# Patient Record
Sex: Female | Born: 2005 | Hispanic: Refuse to answer | Marital: Single | Smoking: Never smoker
Health system: Southern US, Community
[De-identification: ages and names within clinical notes are randomized; demographics above are authoritative.]

## PROBLEM LIST (undated history)

## (undated) DIAGNOSIS — G4733 Obstructive sleep apnea (adult) (pediatric): Secondary | ICD-10-CM

## (undated) DIAGNOSIS — F909 Attention-deficit hyperactivity disorder, unspecified type: Secondary | ICD-10-CM

## (undated) DIAGNOSIS — F419 Anxiety disorder, unspecified: Secondary | ICD-10-CM

## (undated) DIAGNOSIS — Z9989 Dependence on other enabling machines and devices: Secondary | ICD-10-CM

## (undated) HISTORY — PX: TYMPANOSTOMY TUBE PLACEMENT: SHX32

## (undated) HISTORY — PX: ADENOIDECTOMY: SUR15

---
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
## 2023-01-18 ENCOUNTER — Telehealth: Payer: Self-pay | Admitting: Child and Adolescent Psychiatry

## 2023-01-18 NOTE — Telephone Encounter (Signed)
Provider out of office. 7/16 & 7/23 message left answering machine to call and reschedule.

## 2023-01-23 ENCOUNTER — Other Ambulatory Visit: Payer: Self-pay | Admitting: Child and Adolescent Psychiatry

## 2023-01-23 DIAGNOSIS — F331 Major depressive disorder, recurrent, moderate: Secondary | ICD-10-CM

## 2023-01-23 DIAGNOSIS — F418 Other specified anxiety disorders: Secondary | ICD-10-CM

## 2023-01-25 ENCOUNTER — Other Ambulatory Visit: Payer: Self-pay | Admitting: Child and Adolescent Psychiatry

## 2023-01-25 DIAGNOSIS — F418 Other specified anxiety disorders: Secondary | ICD-10-CM

## 2023-01-25 DIAGNOSIS — F331 Major depressive disorder, recurrent, moderate: Secondary | ICD-10-CM

## 2023-01-27 ENCOUNTER — Telehealth: Payer: BC Managed Care – PPO | Admitting: Child and Adolescent Psychiatry

## 2023-02-02 ENCOUNTER — Other Ambulatory Visit: Payer: Self-pay | Admitting: Child and Adolescent Psychiatry

## 2023-02-02 DIAGNOSIS — F902 Attention-deficit hyperactivity disorder, combined type: Secondary | ICD-10-CM

## 2023-02-02 MED ORDER — LISDEXAMFETAMINE DIMESYLATE 30 MG PO CAPS: 30.0000 mg | ORAL_CAPSULE | Freq: Every day | ORAL | 0 refills | Status: DC

## 2023-02-02 NOTE — Telephone Encounter (Signed)
Requested rx sent.

## 2023-02-10 ENCOUNTER — Telehealth: Payer: BC Managed Care – PPO | Admitting: Child and Adolescent Psychiatry

## 2023-02-21 ENCOUNTER — Telehealth (INDEPENDENT_AMBULATORY_CARE_PROVIDER_SITE_OTHER): Payer: BC Managed Care – PPO | Admitting: Child and Adolescent Psychiatry

## 2023-02-21 DIAGNOSIS — F3341 Major depressive disorder, recurrent, in partial remission: Secondary | ICD-10-CM | POA: Diagnosis not present

## 2023-02-21 DIAGNOSIS — F840 Autistic disorder: Secondary | ICD-10-CM

## 2023-02-21 DIAGNOSIS — F418 Other specified anxiety disorders: Secondary | ICD-10-CM

## 2023-02-21 DIAGNOSIS — F902 Attention-deficit hyperactivity disorder, combined type: Secondary | ICD-10-CM | POA: Diagnosis not present

## 2023-02-21 MED ORDER — ESCITALOPRAM OXALATE 5 MG PO TABS: 5.0000 mg | ORAL_TABLET | Freq: Every day | ORAL | 1 refills | Status: DC

## 2023-02-21 NOTE — Progress Notes (Signed)
Virtual Visit via Video Note  I connected with Jasmine Carr on 02/21/23 at 11:30 AM EDT by a video enabled telemedicine application and verified that I am speaking with the correct person using two identifiers.  Location: Patient: home Provider: Office   I discussed the limitations of evaluation and management by telemedicine and the availability of in person appointments. The patient expressed understanding and agreed to proceed   I discussed the assessment and treatment plan with the patient. The patient was provided an opportunity to ask questions and all were answered. The patient agreed with the plan and demonstrated an understanding of the instructions.   The patient was advised to call back or seek an in-person evaluation if the symptoms worsen or if the condition fails to improve as anticipated.   Darcel Smalling, MD    [facility] MD/PA/NP OP Progress Note  02/21/2023 11:51 AM Jasmine Carr  MRN:  [id number]  Chief Complaint: Medication management follow-up for anxiety, ADHD and depression.  HPI:  This is a 17 year old Caucasian female, high school graduate with prior psychiatric history of ADHD, ODD, depression and anxiety, autism spectrum disorder, trichotillomania domiciled with biological parents.   She presents for follow-up after 3 months.  She denies any new concerns for today's appointment and reports that she has continued to do well.  She reports that she went to [place] and really enjoyed her trip.  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  She reports that she has been managing her anxiety okay, anxiety has gone up intermittently in the context of mother's father having dementia as well as court charges against mother for drinking at the [employer]  She reports that she has been managing her anxiety with hydroxyzine and it works well for her.  She denies  any problems with her mood, denies any low lows or depressed mood.  She denies any SI or HI.  She has been sleeping well with trazodone, however has not been using her CPAP, and therefore she has been feeling tired.  Discussed to start using it on a regular basis.  She was receptive to this.  She reports that she was recently seen by endocrinologist, her hemoglobin A1c went down from 5.8 to 5.5, she continues to have abnormal lipid panel.  She is recommended to see a nutritionist and dietitian, and we discussed importance of avoiding sweets which has been a challenge for her.  We discussed to continue with current medications because of the stability with her symptoms and follow-up again in about 3 months or earlier if needed.  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   Visit Diagnosis:    ICD-10-CM   1. Attention deficit hyperactivity disorder (ADHD), combined type  F90.2     2. Autism  F84.0     3. Recurrent major depressive disorder, in partial remission (HCC)  F33.41     4. Other specified anxiety disorders  F41.8           Past Psychiatric History:   Inpatient: None RTC: None Outpatient:     - Meds: She is currently prescribed Prozac 40 mg once a day, Abilify 2 mg at bedtime, clonidine 0.2 mg at bedtime.       - Past medication trials include Seroquel for  sleeping difficulties which was discontinued because of weight gain.  She has also tried various stimulants which were effective however stopped because of parents preference of keeping patient on less number of medications.   They deny any other medication trials.    - Therapy: Short term therapy - [facility]; Therapy off and on through out the life.. [facility] Hx of SI/HI:  None reported  Past Medical History:  Past Medical History:  Diagnosis Date   ADHD    Anxiety    OSA on CPAP  05/11/2018   severe    Past Surgical History:  Procedure Laterality Date   ADENOIDECTOMY     age 51 - [facility]   TYMPANOPLASTY WITH GRAFT Right 08/25/2018   Procedure: TYMPANOPLASTY WITH POSSIBLE CONCHOL CARTILAGE GRAFT;  Surgeon: Linus Salmons, MD;  Location: [facility];  Service: ENT;  Laterality: Right;   TYMPANOSTOMY TUBE PLACEMENT Bilateral    age 40 - [facility]    Family Psychiatric History:     Sister  - Committed suicide - at the age of 77.  Mother - Bipolar disorder Father - ADHD  Family History: No family history on file.  Social History:  Social History   Socioeconomic History   Marital status: Single    Spouse name: Not on file   Number of children: Not on file   Years of education: Not on file   Highest education level: Not on file  Occupational History   Not on file  Tobacco Use   Smoking status: Never   Smokeless tobacco: Never  Vaping Use   Vaping status: Never Used  Substance and Sexual Activity   Alcohol use: Never   Drug use: Never   Sexual activity: Not on file  Other Topics Concern   Not on file  Social History Narrative   Not on file   Social Determinants of Health   Financial Resource Strain: Low Risk  (08/21/2021)   Received [facility], [facility]   Overall Financial Resource Strain (CARDIA)    Difficulty of Paying Living Expenses: Not hard at all  Food Insecurity: No Food Insecurity (08/21/2021)   Received from [facility]   Hunger Vital Sign  Transportation Needs: No Transportation Needs (08/21/2021)   Received from [facility], [facility]   [facility] Transportation    Lack of Transportation (Medical): No    Lack of Transportation (Non-Medical): No  Physical Activity: Not on file  Stress: Not on file  Social Connections: Not on file    Allergies: No Known Allergies  Metabolic Disorder Labs: No results found for: "HGBA1C", "MPG" No results found for:  "PROLACTIN" No results found for: "CHOL", "TRIG", "HDL", "CHOLHDL", "VLDL", "LDLCALC" No results found for: "TSH"  Therapeutic Level Labs: No results found for: "LITHIUM" No results found for: "VALPROATE" No results found for: "CBMZ"  Current Medications: Current Outpatient Medications  Medication Sig Dispense Refill   ARIPiprazole (ABILIFY) 2 MG tablet TAKE 1 TABLET BY MOUTH EVERY DAY 90 tablet 1   escitalopram (LEXAPRO) 10 MG tablet TAKE 1 TABLET BY MOUTH EVERY DAY 90 tablet 1   escitalopram (LEXAPRO) 5 MG tablet Take 1 tablet (5 mg total) by mouth daily. 90 tablet 1   famotidine (PEPCID) 20 MG tablet Take by mouth.     ferrous sulfate 325 (65 FE) MG tablet Take 325 mg by mouth 2 (two) times daily.     fluticasone (FLONASE)  50 MCG/ACT nasal spray Place 1 spray into both nostrils 2 (two) times daily.     hydrOXYzine (ATARAX) 25 MG tablet TAKE 1 TABLET BY MOUTH TWICE A DAY 180 tablet 1   ibuprofen (ADVIL) 600 MG tablet Take 1 tablet (600 mg total) by mouth every 8 (eight) hours as needed. 30 tablet 0   imiquimod (ALDARA) 5 % cream Apply topically 2 (two) times a week.     levonorgestrel-ethinyl estradiol (SEASONALE) 0.15-0.03 MG tablet Take 1 tablet by mouth daily.     lisdexamfetamine (VYVANSE) 30 MG capsule Take 1 capsule (30 mg total) by mouth daily. 30 capsule 0   lisdexamfetamine (VYVANSE) 30 MG capsule Take 1 capsule (30 mg total) by mouth daily. 30 capsule 0   metFORMIN (GLUCOPHAGE) 500 MG tablet Take by mouth.     ofloxacin (FLOXIN) 0.3 % OTIC solution 5 drops 2 (two) times daily.     RYBELSUS 7 MG TABS Take 1 tablet by mouth daily.     traZODone (DESYREL) 100 MG tablet TAKE 1.5 TABLETS (150 MG TOTAL) BY MOUTH AT BEDTIME AS NEEDED FOR SLEEP. 135 tablet 1   triamcinolone cream (KENALOG) 0.1 % Apply topically 2 (two) times daily as needed.     No current facility-administered medications for this visit.     Musculoskeletal: Strength & Muscle Tone: unable to assess since visit  was over the telemedicine. Gait & Station: unable to assess since visit was over the telemedicine. Patient leans: N/A  Psychiatric Specialty Exam: Review of Systems  There were no vitals taken for this visit.There is no height or weight on file to calculate BMI.  General Appearance: Casual and obese  Eye Contact:  Fair  Speech:  Clear and Coherent and Normal Rate  Volume:  Normal  Mood:  Anxious  Affect:  Appropriate, Congruent, and Constricted  Thought Process:  Goal Directed and Linear  Orientation:  Full (Time, Place, and Person)  Thought Content: Logical   Suicidal Thoughts:  No  Homicidal Thoughts:  No  Memory:  Immediate;   Fair Recent;   Fair Remote;   Fair  Judgement:  Fair  Insight:  Fair  Psychomotor Activity:  Normal  Concentration:  Concentration: Fair and Attention Span: Fair  Recall:  [employer] of Knowledge: Fair  Language: Fair  Akathisia:  No    AIMS (if indicated): not done  Assets:  Communication Skills Desire for Improvement Financial Resources/Insurance Leisure Time Physical Health Social Support Transportation Vocational/Educational  ADL's:  Intact  Cognition: WNL  Sleep:   Fair     Screenings: GAD-7    Advertising copywriter from 01/01/2020 in [facility]  Total GAD-7 Score 5      PHQ2-9    Flowsheet Row Counselor from 01/01/2020 in [facility]  PHQ-2 Total Score 2  PHQ-9 Total Score 3      Flowsheet Row ED from 05/10/2022 in [facility]  ED from 08/12/2020 in [facility]   C-SSRS RISK CATEGORY No Risk Error: Question 6 not populated        Assessment and Plan:   17 year old  [place] female with prior psychiatric history of ADHD; ODD; depression; anxiety; autism spectrum disorder; trichotillomania.   Update on 02/21/23  -she appears to have continued stability with mood, ADHD.her anxiety also appears stable  except situational worsening reported by her which she manages by taking hydroxyzine.  Recommending to continue with  current medications because of the stability in her symptoms and follow-up again in about 3 months or earlier if needed.     Plan:  # Depression(improving) Anxiety(improving)  - Continue with Lexapro 15 mg daily.  - Continue with Abilify 2 mg daily.   # ADHD - Discussed to Continue with Vyvanse 30 mg daily for attention problems.  # Sleep - Continue Trazodone 150 mg QHS PRN for sleep.    - Take Atarax 25-50 mg QHS PRN for sleep or anxiety.  - Discussed the recommendation of ind therapy for anxiety and grief.    - Follow up in 2-3 months or early if needed or if symptoms worsens.   This note was generated in part or whole with voice recognition software. Voice recognition is usually quite accurate but there are transcription errors that can and very often do occur. I apologize for any typographical errors that were not detected and corrected.         Darcel Smalling, MD 02/21/2023, 11:51 AM

## 2023-03-15 ENCOUNTER — Telehealth: Payer: Self-pay

## 2023-03-15 NOTE — Telephone Encounter (Signed)
pt left message that stated that she is out of her trazodone and needed refills. pt should have enough pill until oct

## 2023-03-15 NOTE — Telephone Encounter (Signed)
called patient and told her that per the pharmacy she picked up rx on 8-3 and she should have enough until 11-3. pt states that she has been taking 2 pill instead of 1.5pills.

## 2023-03-15 NOTE — Telephone Encounter (Signed)
Please call patient and let her know to not take more than 1.5 tablets. Even if she is taking tow pills she should have until the end of this month.

## 2023-03-15 NOTE — Telephone Encounter (Signed)
called pharmacy pt last picked up on 8-3 pt should have enough until nov 3.

## 2023-03-16 NOTE — Telephone Encounter (Signed)
I spoke with her over the phone. Instructions clearly says it is 1.5 tablets at bedtime. Pt reported that she is taking two of them, and I discussed that she is on 1.5 tablets of trazodone at bedtime of 100 mg. She reported that she does not know if any medications are left at their primary residence but will check. Discussed that they can refill prescription as there is a refill but insurance might not allow them to refill. She verbalized understanding. She will call back for questions.

## 2023-03-16 NOTE — Telephone Encounter (Signed)
pt had her mom get on the phone also. she states that on the bottle it states take 1.5- 2 pills and she is out and she wants to know why you will not refill.

## 2023-04-07 ENCOUNTER — Telehealth: Payer: Self-pay

## 2023-04-07 NOTE — Telephone Encounter (Signed)
pt mother is calling states that Jasmine Carr is out of trazodone and that she is leaving to go to [place] and she needs her medication.

## 2023-04-07 NOTE — Telephone Encounter (Signed)
Please call patient and let her know. Thanks

## 2023-04-07 NOTE — Telephone Encounter (Signed)
called pharmacy and they state that rx was picked up on 8-3 not due again until 11-3 and insurance will not pay for until 10-29.

## 2023-04-07 NOTE — Telephone Encounter (Signed)
Please let her know that I can send prescription of Trazodone 200 mg at bedtime in a hope that it gets covered, but she needs to only take Trazodone 150 mg(1.5 tablets). Will send prescription if they are ok with this. Thanks

## 2023-04-07 NOTE — Telephone Encounter (Signed)
pt mother is aware that she would have to pay for medication - she want to know what else she can take since she taken all of the other medication she had and she needs something to help her sleep.

## 2023-04-08 NOTE — Telephone Encounter (Signed)
I called pt and left VM that we can try sending higher dose of trazodone rx to see if they are able to fill it. Asked her to call back to discuss.

## 2023-05-25 ENCOUNTER — Encounter: Payer: Self-pay | Admitting: Gastroenterology

## 2023-05-30 ENCOUNTER — Telehealth (INDEPENDENT_AMBULATORY_CARE_PROVIDER_SITE_OTHER): Payer: BC Managed Care – PPO | Admitting: Child and Adolescent Psychiatry

## 2023-05-30 DIAGNOSIS — F3341 Major depressive disorder, recurrent, in partial remission: Secondary | ICD-10-CM | POA: Diagnosis not present

## 2023-05-30 DIAGNOSIS — G479 Sleep disorder, unspecified: Secondary | ICD-10-CM

## 2023-05-30 DIAGNOSIS — F909 Attention-deficit hyperactivity disorder, unspecified type: Secondary | ICD-10-CM | POA: Diagnosis not present

## 2023-05-30 DIAGNOSIS — F418 Other specified anxiety disorders: Secondary | ICD-10-CM | POA: Diagnosis not present

## 2023-05-30 DIAGNOSIS — F330 Major depressive disorder, recurrent, mild: Secondary | ICD-10-CM | POA: Insufficient documentation

## 2023-05-30 MED ORDER — TRAZODONE HCL 100 MG PO TABS: 150.0000 mg | ORAL_TABLET | Freq: Every evening | ORAL | 1 refills | Status: DC | PRN

## 2023-05-30 MED ORDER — ARIPIPRAZOLE 2 MG PO TABS: 2.0000 mg | ORAL_TABLET | Freq: Every day | ORAL | 1 refills | Status: DC

## 2023-05-30 NOTE — Progress Notes (Signed)
Virtual Visit via Video Note  I connected with Jasmine Carr on 05/30/23 at 11:30 AM EST by a video enabled telemedicine application and verified that I am speaking with the correct person using two identifiers.  Location: Patient: home Provider: Office   I discussed the limitations of evaluation and management by telemedicine and the availability of in person appointments. The patient expressed understanding and agreed to proceed   I discussed the assessment and treatment plan with the patient. The patient was provided an opportunity to ask questions and all were answered. The patient agreed with the plan and demonstrated an understanding of the instructions.   The patient was advised to call back or seek an in-person evaluation if the symptoms worsen or if the condition fails to improve as anticipated.   Jasmine Smalling, MD    [facility] MD/PA/NP OP Progress Note  05/30/2023 12:38 PM [name]  MRN:  [id number]  Chief Complaint: Medication management follow-up for anxiety, ADHD and depression.  HPI:  This is a 17 year old Caucasian female, high school graduate with prior psychiatric history of ADHD, ODD, depression and anxiety, autism spectrum disorder, trichotillomania domiciled with biological parents.   She presented today for follow-up after 3 months.  She reported that Thanksgiving weekend was difficult, as some of the family members made fun of her, and that caused more anxiety and depression for her.  She reported that additionally she has had other psychosocial stressors in the family such as her grandfather's cancer coming back, financial strains in the family, and that is also made her more anxious and sad.  She reported that this occurs mostly at night and during the day she is doing better.  She reported that last night because of her uncle and his wife were making fun of her and that lead to more anxiety.  We discussed that this seems to be in the  context of her current psychosocial stressors.  She reported that prior to Thanksgiving she was doing better after speaking with her therapist last night and her friend, she has been feeling good today.  She reported that she has been taking medications consistently although her medication refills history does not suggest that, and continued to report that she has been consistently taking her medications.  She reported that she sees her therapist as needed, we discussed to see a therapist on a regular basis, she verbalized understanding and agreed with this.  We discussed medications and she believes that her current medications have been working well for her and would like to continue.  We discussed to continue with current medications, emphasized importance of medication adherence, she was receptive to this.  She denied any SI or HI, reported that she has been eating and sleeping well. She will follow-up again in about 3 months or earlier if needed.    Visit Diagnosis:    ICD-10-CM   1. Other specified anxiety disorders  F41.8     2. Recurrent major depressive disorder, in partial remission (HCC)  F33.41        Past Psychiatric History:   Inpatient: None RTC: None Outpatient:     - Meds: She is currently prescribed Prozac 40 mg once a day, Abilify 2 mg at bedtime, clonidine 0.2 mg at bedtime.       - Past medication trials include Seroquel for sleeping difficulties which was discontinued because of weight gain.  She has also tried various stimulants which were effective however stopped because of parents preference of  keeping patient on less number of medications.   They deny any other medication trials.    - Therapy: Short term therapy - [facility]; Therapy off and on through out the life.. [facility] Hx of SI/HI:  None reported  Past Medical History:  Past Medical History:  Diagnosis Date   ADHD    Anxiety    OSA on CPAP 05/11/2018   severe    Past  Surgical History:  Procedure Laterality Date   ADENOIDECTOMY     age 35 - [facility]   TYMPANOPLASTY WITH GRAFT Right 08/25/2018   Procedure: TYMPANOPLASTY WITH POSSIBLE CONCHOL CARTILAGE GRAFT;  Surgeon: Linus Salmons, MD;  Location: [facility];  Service: ENT;  Laterality: Right;   TYMPANOSTOMY TUBE PLACEMENT Bilateral    age 63 - [facility]    Family Psychiatric History:     Sister  - Committed suicide - at the age of 61.  Mother - Bipolar disorder Father - ADHD  Family History: No family history on file.  Social History:  Social History   Socioeconomic History   Marital status: Single    Spouse name: Not on file   Number of children: Not on file   Years of education: Not on file   Highest education level: Not on file  Occupational History   Not on file  Tobacco Use   Smoking status: Never   Smokeless tobacco: Never  Vaping Use   Vaping status: Never Used  Substance and Sexual Activity   Alcohol use: Never   Drug use: Never   Sexual activity: Not on file  Other Topics Concern   Not on file  Social History Narrative   Not on file   Social Determinants of Health   Financial Resource Strain: Patient Declined (04/27/2023)   Received from [facility]   Overall Financial Resource Strain (CARDIA)    Difficulty of Paying Living Expenses: Patient declined  Food Insecurity: Patient Declined (04/27/2023)   Received from [facility]   Hunger Vital Sign    Worried About Running Out of Food in the Last Year: Patient declined    Ran Out of Food in the Last Year: Patient declined  Transportation Needs: Patient Declined (04/27/2023)   Received from [facility]   PRAPARE - Transportation    In the past 12 months, has lack of transportation kept you from medical appointments or from getting medications?: Patient declined    Lack of Transportation (Non-Medical): Patient declined  Physical  Activity: Not on file  Stress: Not on file  Social Connections: Not on file    Allergies: No Known Allergies  Metabolic Disorder Labs: No results found for: "HGBA1C", "MPG" No results found for: "PROLACTIN" No results found for: "CHOL", "TRIG", "HDL", "CHOLHDL", "VLDL", "LDLCALC" No results found for: "TSH"  Therapeutic Level Labs: No results found for: "LITHIUM" No results found for: "VALPROATE" No results found for: "CBMZ"  Current Medications: Current Outpatient Medications  Medication Sig Dispense Refill   ARIPiprazole (ABILIFY) 2 MG tablet Take 1 tablet (2 mg total) by mouth daily. 90 tablet 1   escitalopram (LEXAPRO) 10 MG tablet TAKE 1 TABLET BY MOUTH EVERY DAY 90 tablet 1   escitalopram (LEXAPRO) 5 MG tablet Take 1 tablet (5 mg total) by mouth daily. 90 tablet 1   famotidine (PEPCID) 20 MG tablet Take by mouth.     ferrous sulfate 325 (65 FE) MG tablet Take 325 mg  by mouth 2 (two) times daily.     fluticasone (FLONASE) 50 MCG/ACT nasal spray Place 1 spray into both nostrils 2 (two) times daily.     hydrOXYzine (ATARAX) 25 MG tablet TAKE 1 TABLET BY MOUTH TWICE A DAY 180 tablet 1   ibuprofen (ADVIL) 600 MG tablet Take 1 tablet (600 mg total) by mouth every 8 (eight) hours as needed. 30 tablet 0   imiquimod (ALDARA) 5 % cream Apply topically 2 (two) times a week.     levonorgestrel-ethinyl estradiol (SEASONALE) 0.15-0.03 MG tablet Take 1 tablet by mouth daily.     lisdexamfetamine (VYVANSE) 30 MG capsule Take 1 capsule (30 mg total) by mouth daily. 30 capsule 0   lisdexamfetamine (VYVANSE) 30 MG capsule Take 1 capsule (30 mg total) by mouth daily. 30 capsule 0   metFORMIN (GLUCOPHAGE) 500 MG tablet Take by mouth.     ofloxacin (FLOXIN) 0.3 % OTIC solution 5 drops 2 (two) times daily.     RYBELSUS 7 MG TABS Take 1 tablet by mouth daily.     traZODone (DESYREL) 100 MG tablet Take 1.5 tablets (150 mg total) by mouth at bedtime as needed for sleep. 135 tablet 1   triamcinolone  cream (KENALOG) 0.1 % Apply topically 2 (two) times daily as needed.     No current facility-administered medications for this visit.     Musculoskeletal: Strength & Muscle Tone: unable to assess since visit was over the telemedicine. Gait & Station: unable to assess since visit was over the telemedicine. Patient leans: N/A  Psychiatric Specialty Exam: Review of Systems  There were no vitals taken for this visit.There is no height or weight on file to calculate BMI.  General Appearance: Casual and obese  Eye Contact:  Fair  Speech:  Clear and Coherent and Normal Rate  Volume:  Normal  Mood:  Anxious  Affect:  Appropriate, Congruent, and Restricted  Thought Process:  Goal Directed and Linear  Orientation:  Full (Time, Place, and Person)  Thought Content: Logical   Suicidal Thoughts:  No  Homicidal Thoughts:  No  Memory:  Immediate;   Fair Recent;   Fair Remote;   Fair  Judgement:  Fair  Insight:  Fair  Psychomotor Activity:  Normal  Concentration:  Concentration: Fair and Attention Span: Fair  Recall:  [employer] of Knowledge: Fair  Language: Fair  Akathisia:  No    AIMS (if indicated): not done  Assets:  Communication Skills Desire for Improvement Financial Resources/Insurance Leisure Time Physical Health Social Support Transportation Vocational/Educational  ADL's:  Intact  Cognition: WNL  Sleep:   Fair     Screenings: GAD-7    Advertising copywriter from 01/01/2020 in [facility]  Total GAD-7 Score 5      PHQ2-9    Flowsheet Row Counselor from 01/01/2020 in [facility]  PHQ-2 Total Score 2  PHQ-9 Total Score 3      Flowsheet Row ED from 05/10/2022 in [facility]  ED from 08/12/2020 in [facility]   C-SSRS RISK CATEGORY No Risk Error: Question 6 not populated        Assessment and Plan:   17 year old  [place] female with  prior psychiatric history of ADHD; ODD; depression; anxiety; autism spectrum disorder; trichotillomania.   Update on 05/30/23  -she appears to have intermittent worsening of anxiety and mood in the context of current psychosocial stressors, however  reported that she has been able to manage mood and anxiety, is recommended to establish outpatient therapy on a regular basis as compared to as needed which she is currently working on.  She would like to continue with current medications and will follow up again in about 3 months or earlier if needed.       Plan:  # Depression(improving) Anxiety(improving)  - Continue with Lexapro 15 mg daily.  - Continue with Abilify 2 mg daily.   # ADHD - Discussed to Continue with Vyvanse 30 mg daily for attention problems.  # Sleep - Continue Trazodone 150 mg QHS PRN for sleep.    - Take Atarax 25-50 mg QHS PRN for sleep or anxiety.  - Discussed the recommendation of ind therapy for anxiety and grief.    - Follow up in 2-3 months or early if needed or if symptoms worsens.   This note was generated in part or whole with voice recognition software. Voice recognition is usually quite accurate but there are transcription errors that can and very often do occur. I apologize for any typographical errors that were not detected and corrected.         Jasmine Smalling, MD 05/30/2023, 12:38 PM

## 2023-05-30 NOTE — Telephone Encounter (Signed)
She did attend appointment and scheduled for follow up in 3 months.

## 2023-06-02 IMAGING — CR DG TOE GREAT 2+V*L*
4 series · 4 of 4 positions shown · non-contrast
Comparison: None

CLINICAL DATA: Kicked a table is school 2 days ago, pain great toe

EXAM:
LEFT GREAT TOE

[toe ap]
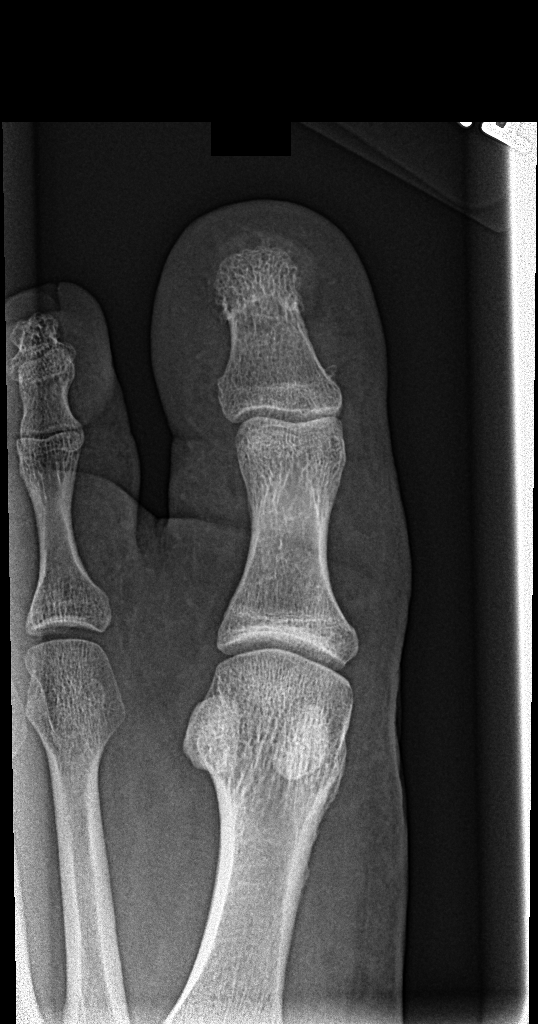

[toe obl (1 of 2)]
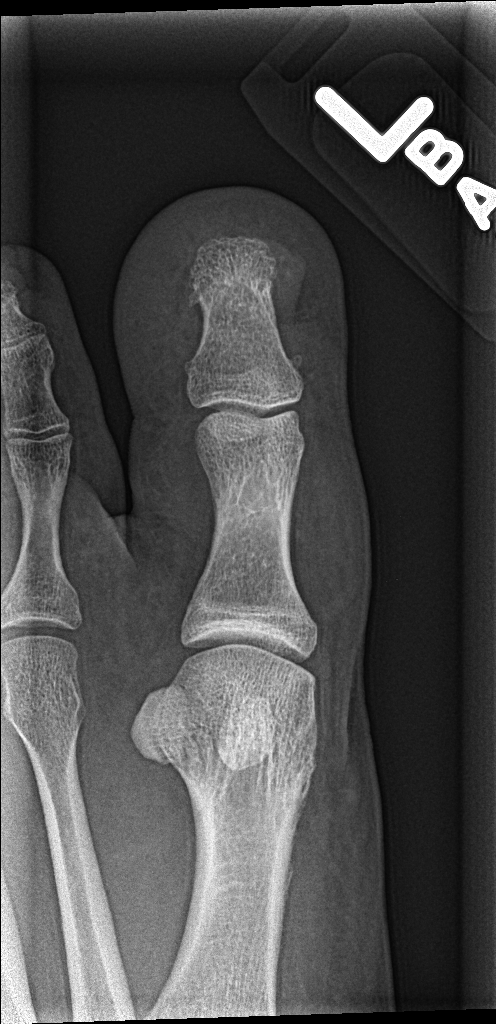

[toe lat]
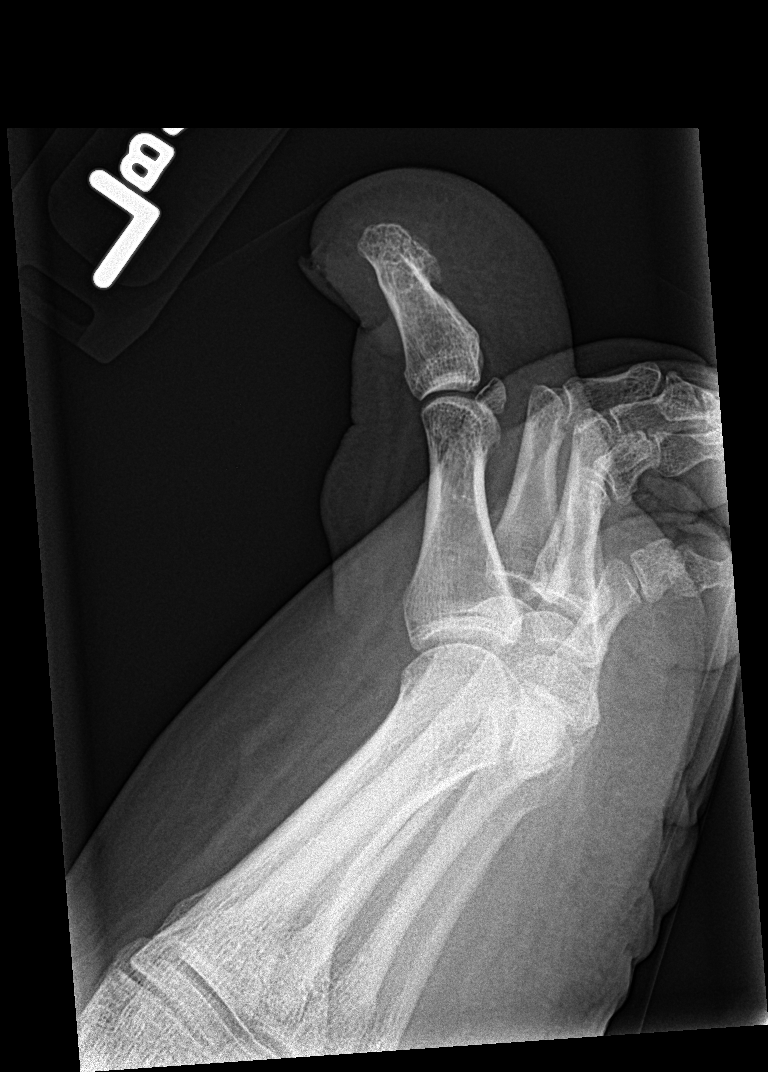

[toe obl (2 of 2)]
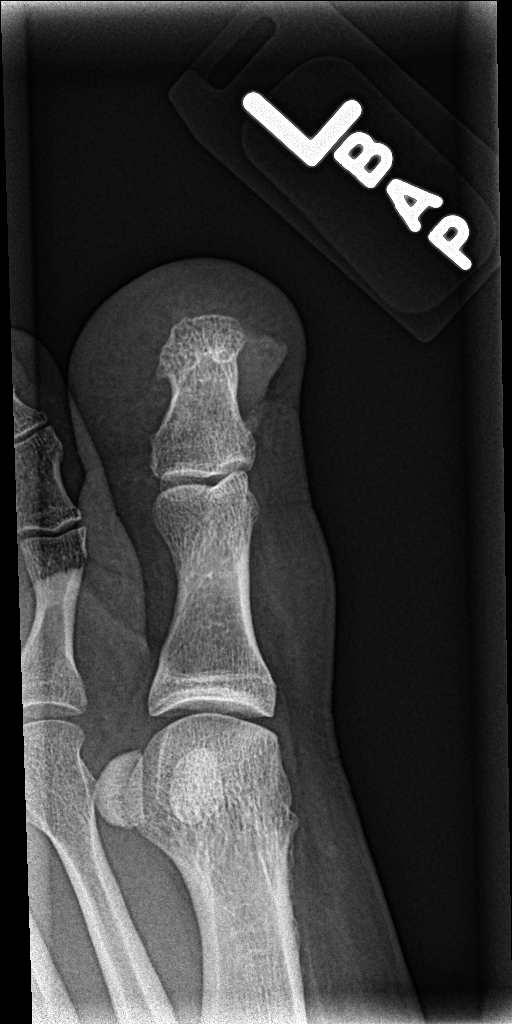

[4 of 4 positions shown; findings below may reference images not displayed]

FINDINGS: Osseous mineralization normal.

Joint spaces preserved.

No acute fracture, dislocation, or bone destruction.
IMPRESSION: Normal exam.

## 2023-06-23 ENCOUNTER — Other Ambulatory Visit: Payer: Self-pay | Admitting: Child and Adolescent Psychiatry

## 2023-06-23 DIAGNOSIS — F902 Attention-deficit hyperactivity disorder, combined type: Secondary | ICD-10-CM

## 2023-06-23 MED ORDER — LISDEXAMFETAMINE DIMESYLATE 30 MG PO CAPS: 30.0000 mg | ORAL_CAPSULE | Freq: Every day | ORAL | 0 refills | Status: DC

## 2023-06-23 NOTE — Telephone Encounter (Signed)
Rx sent 

## 2023-06-30 ENCOUNTER — Ambulatory Visit: Admission: RE | Admit: 2023-06-30 | Payer: Self-pay | Source: Ambulatory Visit | Admitting: Gastroenterology

## 2023-06-30 SURGERY — COLONOSCOPY WITH PROPOFOL
Anesthesia: General

## 2023-08-28 ENCOUNTER — Telehealth: Payer: Self-pay | Admitting: Child and Adolescent Psychiatry

## 2023-08-28 DIAGNOSIS — F418 Other specified anxiety disorders: Secondary | ICD-10-CM

## 2023-08-28 DIAGNOSIS — F331 Major depressive disorder, recurrent, moderate: Secondary | ICD-10-CM

## 2023-08-28 NOTE — Telephone Encounter (Signed)
 Patient cancelled by phone. Confirmed on 08-28-23. She currently has no insurance but will call when able to come back. States she does need a refill on medication if able to do so to help bridge the gap.

## 2023-08-29 ENCOUNTER — Telehealth: Payer: Self-pay | Admitting: Child and Adolescent Psychiatry

## 2023-08-29 MED ORDER — ESCITALOPRAM OXALATE 5 MG PO TABS: 5.0000 mg | ORAL_TABLET | Freq: Every day | ORAL | 1 refills | Status: DC

## 2023-08-29 MED ORDER — ESCITALOPRAM OXALATE 10 MG PO TABS: 10.0000 mg | ORAL_TABLET | Freq: Every day | ORAL | 1 refills | Status: DC

## 2023-08-29 NOTE — Telephone Encounter (Signed)
 Called patient and made her aware of message she voiced understanding

## 2023-08-29 NOTE — Telephone Encounter (Signed)
 Thanks

## 2023-08-29 NOTE — Telephone Encounter (Signed)
 I sent refills on Lexapro. She should have refills left on her other medications(Abilify, Vyvanse and Trazodone). Thanks

## 2023-10-11 ENCOUNTER — Telehealth: Payer: Self-pay | Admitting: Physician Assistant

## 2023-10-11 ENCOUNTER — Ambulatory Visit: Payer: Self-pay

## 2023-10-11 DIAGNOSIS — B9689 Other specified bacterial agents as the cause of diseases classified elsewhere: Secondary | ICD-10-CM

## 2023-10-11 DIAGNOSIS — J208 Acute bronchitis due to other specified organisms: Secondary | ICD-10-CM

## 2023-10-11 MED ORDER — ALBUTEROL SULFATE HFA 108 (90 BASE) MCG/ACT IN AERS: 2.0000 | INHALATION_SPRAY | Freq: Four times a day (QID) | RESPIRATORY_TRACT | 0 refills | Status: AC | PRN

## 2023-10-11 MED ORDER — BENZONATATE 100 MG PO CAPS: 100.0000 mg | ORAL_CAPSULE | Freq: Three times a day (TID) | ORAL | 0 refills | Status: DC | PRN

## 2023-10-11 MED ORDER — AZITHROMYCIN 250 MG PO TABS: ORAL_TABLET | ORAL | 0 refills | Status: AC

## 2023-10-11 NOTE — Telephone Encounter (Signed)
 Copied from CRM [id number]. Topic: Clinical - Red Word Triage >> Oct 11, 2023  3:22 PM Jasmine Carr wrote: [facility] that prompted transfer to Nurse Triage: fever (101.7), cough, congestion, sore throat   Chief Complaint: Dry cough, Symptoms: dry cough, fever, nausea, diarrhea, sore throat, headache Frequency: x 3 days Pertinent Negatives: Patient denies difficulty, vomiting, chest pain Disposition: [] ED /[x] Urgent Care (no appt availability in office) / [] Appointment(In office/virtual)/ []  [facility]/ [] Home Care/ [] Refused Recommended Disposition /[] [facility]/ []  Follow-up with PCP Additional Notes: Patient'Carr mother called and advised they took an 8 day cruise out of the country and started feeling bad 3 days ago. They returned back from the cruise 2 days ago.  They did not have any known contacts with anyone sick that they are aware of. She had a fever last night and had been taking Tylenol.  Mother hasn't checked her temperature today.  She is having a dry cough, nausea, diarrhea, sore throat, and headache. Mother denies the patient having any difficulty breathing, vomiting, or chest pain at this time. Mother is advised that the PCP office that she wants to get her daughter established at, [facility], does not have any openings anytime soon for her to be seen. Mother is advised that the best recommendation at this time is to take her to an Urgent Care or the Emergency Room to get her evaluated for her symptoms at this time and then after she is taken care of, she can call back to the office and become an established patient for continuation of care. Mother is advised that if the patient gets worse to take her to the Emergency Room for immediate medical attention. Mother verbalized understanding but stated that if she had known there were no appointments available, she wouldn't have gone through the triage questions with this RN.  Reason for Disposition  SEVERE  coughing spells (e.g., whooping sound after coughing, vomiting after coughing)  Answer Assessment - Initial Assessment Questions 1. ONSET: "When did the cough begin?"      3 days ago 2. SEVERITY: "How bad is the cough today?"      Coughing spells 3. SPUTUM: "Describe the color of your sputum" (none, dry cough; clear, white, yellow, green)     N/A 4. HEMOPTYSIS: "Are you coughing up any blood?" If so ask: "How much?" (flecks, streaks, tablespoons, etc.)     No 5. DIFFICULTY BREATHING: "Are you having difficulty breathing?" If Yes, ask: "How bad is it?" (e.g., mild, moderate, severe)    - MILD: No SOB at rest, mild SOB with walking, speaks normally in sentences, can lie down, no retractions, pulse < 100.    - MODERATE: SOB at rest, SOB with minimal exertion and prefers to sit, cannot lie down flat, speaks in phrases, mild retractions, audible wheezing, pulse 100-120.    - SEVERE: Very SOB at rest, speaks in single words, struggling to breathe, sitting hunched forward, retractions, pulse > 120      No 6. FEVER: "Do you have a fever?" If Yes, ask: "What is your temperature, how was it measured, and when did it start?"     Yes--last night was 101.7---taking Tylenol 7. CARDIAC HISTORY: "Do you have any history of heart disease?" (e.g., heart attack, congestive heart failure)      No 8. LUNG HISTORY: "Do you have any history of lung disease?"  (e.g., pulmonary embolus, asthma, emphysema)     Has a rescue inhaler but no diagnosed  lung conditions 9. PE RISK FACTORS: "Do you have a history of blood clots?" (or: recent major surgery, recent prolonged travel, bedridden)     No 10. OTHER SYMPTOMS: "Do you have any other symptoms?" (e.g., runny nose, wheezing, chest pain)       Runny nose, congestion, Fever, sore throat 11. PREGNANCY: "Is there any chance you are pregnant?" "When was your last menstrual period?"       No--90 day birth control 83. TRAVEL: "Have you traveled out of the country in the  last month?" (e.g., travel history, exposures)       Yes---8 day cruise out of the country  Protocols used: Cough - Acute Non-Productive-A-AH

## 2023-10-11 NOTE — Progress Notes (Signed)
 Virtual Visit Consent   Jasmine Carr, you are scheduled for a virtual visit with a [facility] provider today. Just as with appointments in the office, your consent must be obtained to participate. Your consent will be active for this visit and any virtual visit you may have with one of our providers in the next 365 days. If you have a MyChart account, a copy of this consent can be sent to you electronically.  As this is a virtual visit, video technology does not allow for your provider to perform a traditional examination. This may limit your provider's ability to fully assess your condition. If your provider identifies any concerns that need to be evaluated in person or the need to arrange testing (such as labs, EKG, etc.), we will make arrangements to do so. Although advances in technology are sophisticated, we cannot ensure that it will always work on either your end or our end. If the connection with a video visit is poor, the visit may have to be switched to a telephone visit. With either a video or telephone visit, we are not always able to ensure that we have a secure connection.  By engaging in this virtual visit, you consent to the provision of healthcare and authorize for your insurance to be billed (if applicable) for the services provided during this visit. Depending on your insurance coverage, you may receive a charge related to this service.  I need to obtain your verbal consent now. Are you willing to proceed with your visit today? Tekoa Suzanne Ruehl has provided verbal consent on 10/11/2023 for a virtual visit (video or telephone). Jasmine Carr, [place]  Date: 10/11/2023 4:44 PM   Virtual Visit via Video Note   I, Jasmine Carr, connected with  Jasmine Carr  ([id number], [birth year]) on 10/11/23 at  4:45 PM EDT by a video-enabled telemedicine application and verified that I am speaking with the correct person using two  identifiers.  Location: Patient: Virtual Visit Location Patient: Home Provider: Virtual Visit Location Provider: Home Office   I discussed the limitations of evaluation and management by telemedicine and the availability of in person appointments. The patient expressed understanding and agreed to proceed.    History of Present Illness: Jasmine Carr is a 18 y.o. who identifies as a female who was assigned female at birth, and is being seen today for URI symptoms starting over the past week. Notes chest congestion, cough that is productive, nausea with nasal congestion. Notes now with fever at 101.7. Notes some chest tightness with wheezing. Denies sick contact.   OTC -- Tylenol  HPI: HPI  Problems:  Patient Active Problem List   Diagnosis Date Noted   Mild episode of recurrent major depressive disorder (HCC) 05/30/2023   Attention deficit hyperactivity disorder (ADHD), combined type 09/25/2019   Recurrent major depressive disorder, in partial remission (HCC) 09/25/2019   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   Other insomnia 09/25/2019   Autism 09/25/2019    Allergies: No Known Allergies Medications:  Current Outpatient Medications:    albuterol (VENTOLIN HFA) 108 (90 Base) MCG/ACT inhaler, Inhale 2 puffs into the lungs every 6 (six) hours as needed for wheezing or shortness of breath., Disp: 8 g, Rfl: 0   azithromycin (ZITHROMAX) 250 MG tablet, Take 2 tablets on day 1, then 1 tablet daily on days 2 through 5, Disp: 6 tablet, Rfl: 0   benzonatate (TESSALON) 100 MG capsule, Take 1 capsule (100 mg total) by mouth 3 (three)  times daily as needed for cough., Disp: 30 capsule, Rfl: 0   ARIPiprazole (ABILIFY) 2 MG tablet, Take 1 tablet (2 mg total) by mouth daily., Disp: 90 tablet, Rfl: 1   escitalopram (LEXAPRO) 10 MG tablet, Take 1 tablet (10 mg total) by mouth daily., Disp: 90 tablet, Rfl: 1   escitalopram (LEXAPRO) 5 MG tablet, Take 1 tablet (5 mg total) by mouth  daily., Disp: 90 tablet, Rfl: 1   famotidine (PEPCID) 20 MG tablet, Take by mouth., Disp: , Rfl:    ferrous sulfate 325 (65 FE) MG tablet, Take 325 mg by mouth 2 (two) times daily., Disp: , Rfl:    fluticasone (FLONASE) 50 MCG/ACT nasal spray, Place 1 spray into both nostrils 2 (two) times daily., Disp: , Rfl:    hydrOXYzine (ATARAX) 25 MG tablet, TAKE 1 TABLET BY MOUTH TWICE A DAY, Disp: 180 tablet, Rfl: 1   ibuprofen (ADVIL) 600 MG tablet, Take 1 tablet (600 mg total) by mouth every 8 (eight) hours as needed., Disp: 30 tablet, Rfl: 0   imiquimod (ALDARA) 5 % cream, Apply topically 2 (two) times a week., Disp: , Rfl:    levonorgestrel-ethinyl estradiol (SEASONALE) 0.15-0.03 MG tablet, Take 1 tablet by mouth daily., Disp: , Rfl:    lisdexamfetamine (VYVANSE) 30 MG capsule, Take 1 capsule (30 mg total) by mouth daily., Disp: 30 capsule, Rfl: 0   lisdexamfetamine (VYVANSE) 30 MG capsule, Take 1 capsule (30 mg total) by mouth daily., Disp: 30 capsule, Rfl: 0   metFORMIN (GLUCOPHAGE) 500 MG tablet, Take by mouth., Disp: , Rfl:    ofloxacin (FLOXIN) 0.3 % OTIC solution, 5 drops 2 (two) times daily., Disp: , Rfl:    RYBELSUS 7 MG TABS, Take 1 tablet by mouth daily., Disp: , Rfl:    traZODone (DESYREL) 100 MG tablet, Take 1.5 tablets (150 mg total) by mouth at bedtime as needed for sleep., Disp: 135 tablet, Rfl: 1   triamcinolone cream (KENALOG) 0.1 %, Apply topically 2 (two) times daily as needed., Disp: , Rfl:   Observations/Objective: Patient is well-developed, well-nourished in no acute distress.  Resting comfortably at home.  Head is normocephalic, atraumatic.  No labored breathing. Speech is clear and coherent with logical content.  Patient is alert and oriented at baseline.   Assessment and Plan: 1. Acute bacterial bronchitis (Primary) - azithromycin (ZITHROMAX) 250 MG tablet; Take 2 tablets on day 1, then 1 tablet daily on days 2 through 5  Dispense: 6 tablet; Refill: 0 - benzonatate  (TESSALON) 100 MG capsule; Take 1 capsule (100 mg total) by mouth 3 (three) times daily as needed for cough.  Dispense: 30 capsule; Refill: 0 - albuterol (VENTOLIN HFA) 108 (90 Base) MCG/ACT inhaler; Inhale 2 puffs into the lungs every 6 (six) hours as needed for wheezing or shortness of breath.  Dispense: 8 g; Refill: 0  Rx Azithromycin.  Increase fluids.  Rest.  Saline nasal spray.  Probiotic.  Mucinex as directed.  Humidifier in bedroom. Tessalon and albuterol per orders.  Call or return to clinic if symptoms are not improving.   Follow Up Instructions: I discussed the assessment and treatment plan with the patient. The patient was provided an opportunity to ask questions and all were answered. The patient agreed with the plan and demonstrated an understanding of the instructions.  A copy of instructions were sent to the patient via MyChart unless otherwise noted below.   The patient was advised to call back or seek an in-person evaluation if the  symptoms worsen or if the condition fails to improve as anticipated.    Jasmine Maillard, PA-C

## 2023-10-11 NOTE — Patient Instructions (Signed)
 Jasmine Carr, thank you for joining [name], PA-C for today's virtual visit.  While this provider is not your primary care provider (PCP), if your PCP is located in our provider database this encounter information will be shared with them immediately following your visit.   A [facility] MyChart account gives you access to today's visit and all your visits, tests, and labs performed at [facility] " click here if you don't have a Jasmine Carr MyChart account or go to [email or web address]  Consent: (Patient) Jasmine Carr provided verbal consent for this virtual visit at the beginning of the encounter.  Current Medications:  Current Outpatient Medications:    ARIPiprazole (ABILIFY) 2 MG tablet, Take 1 tablet (2 mg total) by mouth daily., Disp: 90 tablet, Rfl: 1   escitalopram (LEXAPRO) 10 MG tablet, Take 1 tablet (10 mg total) by mouth daily., Disp: 90 tablet, Rfl: 1   escitalopram (LEXAPRO) 5 MG tablet, Take 1 tablet (5 mg total) by mouth daily., Disp: 90 tablet, Rfl: 1   famotidine (PEPCID) 20 MG tablet, Take by mouth., Disp: , Rfl:    ferrous sulfate 325 (65 FE) MG tablet, Take 325 mg by mouth 2 (two) times daily., Disp: , Rfl:    fluticasone (FLONASE) 50 MCG/ACT nasal spray, Place 1 spray into both nostrils 2 (two) times daily., Disp: , Rfl:    hydrOXYzine (ATARAX) 25 MG tablet, TAKE 1 TABLET BY MOUTH TWICE A DAY, Disp: 180 tablet, Rfl: 1   ibuprofen (ADVIL) 600 MG tablet, Take 1 tablet (600 mg total) by mouth every 8 (eight) hours as needed., Disp: 30 tablet, Rfl: 0   imiquimod (ALDARA) 5 % cream, Apply topically 2 (two) times a week., Disp: , Rfl:    levonorgestrel-ethinyl estradiol (SEASONALE) 0.15-0.03 MG tablet, Take 1 tablet by mouth daily., Disp: , Rfl:    lisdexamfetamine (VYVANSE) 30 MG capsule, Take 1 capsule (30 mg total) by mouth daily., Disp: 30 capsule, Rfl: 0   lisdexamfetamine (VYVANSE) 30 MG capsule, Take 1 capsule  (30 mg total) by mouth daily., Disp: 30 capsule, Rfl: 0   metFORMIN (GLUCOPHAGE) 500 MG tablet, Take by mouth., Disp: , Rfl:    ofloxacin (FLOXIN) 0.3 % OTIC solution, 5 drops 2 (two) times daily., Disp: , Rfl:    RYBELSUS 7 MG TABS, Take 1 tablet by mouth daily., Disp: , Rfl:    traZODone (DESYREL) 100 MG tablet, Take 1.5 tablets (150 mg total) by mouth at bedtime as needed for sleep., Disp: 135 tablet, Rfl: 1   triamcinolone cream (KENALOG) 0.1 %, Apply topically 2 (two) times daily as needed., Disp: , Rfl:    Medications ordered in this encounter:  No orders of the defined types were placed in this encounter.    *If you need refills on other medications prior to your next appointment, please contact your pharmacy*  Follow-Up: Call back or seek an in-person evaluation if the symptoms worsen or if the condition fails to improve as anticipated.  [facility] [phone]  Other Instructions Take antibiotic (Azithromycin) as directed.  Increase fluids.  Get plenty of rest. Use Mucinex for congestion. Use the tessalon and albuterol as directed. Take a daily probiotic (I recommend Align or Culturelle, but even Activia Yogurt may be beneficial).  A humidifier placed in the bedroom may offer some relief for a dry, scratchy throat of nasal irritation.  Read information below on acute bronchitis. Please call or return to clinic if symptoms are not improving.  Acute Bronchitis Bronchitis is when the airways that extend from the windpipe into the lungs get red, puffy, and painful (inflamed). Bronchitis often causes thick spit (mucus) to develop. This leads to a cough. A cough is the most common symptom of bronchitis. In acute bronchitis, the condition usually begins suddenly and goes away over time (usually in 2 weeks). Smoking, allergies, and asthma can make bronchitis worse. Repeated episodes of bronchitis may cause more lung problems.  HOME CARE Rest. Drink enough fluids to keep  your pee (urine) clear or pale yellow (unless you need to limit fluids as told by your doctor). Only take over-the-counter or prescription medicines as told by your doctor. Avoid smoking and secondhand smoke. These can make bronchitis worse. If you are a smoker, think about using nicotine gum or skin patches. Quitting smoking will help your lungs heal faster. Reduce the chance of getting bronchitis again by: Washing your hands often. Avoiding people with cold symptoms. Trying not to touch your hands to your mouth, nose, or eyes. Follow up with your doctor as told.  GET HELP IF: Your symptoms do not improve after 1 week of treatment. Symptoms include: Cough. Fever. Coughing up thick spit. Body aches. Chest congestion. Chills. Shortness of breath. Sore throat.  GET HELP RIGHT AWAY IF:  You have an increased fever. You have chills. You have severe shortness of breath. You have bloody thick spit (sputum). You throw up (vomit) often. You lose too much body fluid (dehydration). You have a severe headache. You faint.  MAKE SURE YOU:  Understand these instructions. Will watch your condition. Will get help right away if you are not doing well or get worse. Document Released: 12/01/2007 Document Revised: 02/14/2013 Document Reviewed: 12/05/2012 [facility] Patient Information 2015 [place], [place]. This information is not intended to replace advice given to you by your health care provider. Make sure you discuss any questions you have with your health care provider.    If you have been instructed to have an in-person evaluation today at a local Urgent Care facility, please use the link below. It will take you to a list of all of our available [facility], including address, phone number and hours of operation. Please do not delay care.  [facility]  If you or a family member do not have a primary care provider, use the link below to schedule a visit and establish  care. When you choose a [facility] primary care physician or advanced practice provider, you gain a long-term partner in health. Find a Primary Care Provider  Learn more about [facility]'s in-office and virtual care options: [facility] [place] - Get Care Now

## 2023-10-18 ENCOUNTER — Telehealth: Payer: Self-pay | Admitting: Family Medicine

## 2023-10-18 DIAGNOSIS — J4540 Moderate persistent asthma, uncomplicated: Secondary | ICD-10-CM

## 2023-10-18 MED ORDER — PROMETHAZINE-DM 6.25-15 MG/5ML PO SYRP: 5.0000 mL | ORAL_SOLUTION | Freq: Four times a day (QID) | ORAL | 0 refills | Status: AC | PRN

## 2023-10-18 MED ORDER — PREDNISONE 20 MG PO TABS: 20.0000 mg | ORAL_TABLET | Freq: Two times a day (BID) | ORAL | 0 refills | Status: AC

## 2023-10-18 NOTE — Progress Notes (Signed)
 Virtual Visit Consent   Jasmine Carr, you are scheduled for a virtual visit with a [facility] provider today. Just as with appointments in the office, your consent must be obtained to participate. Your consent will be active for this visit and any virtual visit you may have with one of our providers in the next 365 days. If you have a MyChart account, a copy of this consent can be sent to you electronically.  As this is a virtual visit, video technology does not allow for your provider to perform a traditional examination. This may limit your provider's ability to fully assess your condition. If your provider identifies any concerns that need to be evaluated in person or the need to arrange testing (such as labs, EKG, etc.), we will make arrangements to do so. Although advances in technology are sophisticated, we cannot ensure that it will always work on either your end or our end. If the connection with a video visit is poor, the visit may have to be switched to a telephone visit. With either a video or telephone visit, we are not always able to ensure that we have a secure connection.  By engaging in this virtual visit, you consent to the provision of healthcare and authorize for your insurance to be billed (if applicable) for the services provided during this visit. Depending on your insurance coverage, you may receive a charge related to this service.  I need to obtain your verbal consent now. Are you willing to proceed with your visit today? Jasmine Carr has provided verbal consent on 10/18/2023 for a virtual visit (video or telephone). Albertha Huger, FNP  Date: 10/18/2023 7:45 PM   Virtual Visit via Video Note   I, Albertha Huger, connected with  Jasmine Carr  ([id number], February 05, 2006) on 10/18/23 at  7:45 PM EDT by a video-enabled telemedicine application and verified that I am speaking with the correct person using two  identifiers.  Location: Patient: Virtual Visit Location Patient: Home Provider: Virtual Visit Location Provider: Home Office   I discussed the limitations of evaluation and management by telemedicine and the availability of in person appointments. The patient expressed understanding and agreed to proceed.    History of Present Illness: Jasmine Carr is a 18 y.o. who identifies as a female who was assigned female at birth, and is being seen today for persistent cough since treated on the 15th with a zpack, tessalon  and albuterol . She has no fever.Aaron Aas  HPI: HPI  Problems:  Patient Active Problem List   Diagnosis Date Noted   Mild episode of recurrent major depressive disorder (HCC) 05/30/2023   Attention deficit hyperactivity disorder (ADHD), combined type 09/25/2019   Recurrent major depressive disorder, in partial remission (HCC) 09/25/2019   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   Other insomnia 09/25/2019   Autism 09/25/2019    Allergies: No Known Allergies Medications:  Current Outpatient Medications:    albuterol  (VENTOLIN  HFA) 108 (90 Base) MCG/ACT inhaler, Inhale 2 puffs into the lungs every 6 (six) hours as needed for wheezing or shortness of breath., Disp: 8 g, Rfl: 0   ARIPiprazole  (ABILIFY ) 2 MG tablet, Take 1 tablet (2 mg total) by mouth daily., Disp: 90 tablet, Rfl: 1   benzonatate  (TESSALON ) 100 MG capsule, Take 1 capsule (100 mg total) by mouth 3 (three) times daily as needed for cough., Disp: 30 capsule, Rfl: 0   escitalopram  (LEXAPRO ) 10 MG tablet, Take 1 tablet (10 mg total) by mouth daily., Disp: 90  tablet, Rfl: 1   escitalopram  (LEXAPRO ) 5 MG tablet, Take 1 tablet (5 mg total) by mouth daily., Disp: 90 tablet, Rfl: 1   famotidine (PEPCID) 20 MG tablet, Take by mouth., Disp: , Rfl:    ferrous sulfate 325 (65 FE) MG tablet, Take 325 mg by mouth 2 (two) times daily., Disp: , Rfl:    fluticasone (FLONASE) 50 MCG/ACT nasal spray, Place 1 spray into  both nostrils 2 (two) times daily., Disp: , Rfl:    hydrOXYzine  (ATARAX ) 25 MG tablet, TAKE 1 TABLET BY MOUTH TWICE A DAY, Disp: 180 tablet, Rfl: 1   ibuprofen  (ADVIL ) 600 MG tablet, Take 1 tablet (600 mg total) by mouth every 8 (eight) hours as needed., Disp: 30 tablet, Rfl: 0   imiquimod (ALDARA) 5 % cream, Apply topically 2 (two) times a week., Disp: , Rfl:    levonorgestrel-ethinyl estradiol (SEASONALE) 0.15-0.03 MG tablet, Take 1 tablet by mouth daily., Disp: , Rfl:    lisdexamfetamine (VYVANSE ) 30 MG capsule, Take 1 capsule (30 mg total) by mouth daily., Disp: 30 capsule, Rfl: 0   lisdexamfetamine (VYVANSE ) 30 MG capsule, Take 1 capsule (30 mg total) by mouth daily., Disp: 30 capsule, Rfl: 0   metFORMIN (GLUCOPHAGE) 500 MG tablet, Take by mouth., Disp: , Rfl:    ofloxacin (FLOXIN) 0.3 % OTIC solution, 5 drops 2 (two) times daily., Disp: , Rfl:    RYBELSUS 7 MG TABS, Take 1 tablet by mouth daily., Disp: , Rfl:    traZODone  (DESYREL ) 100 MG tablet, Take 1.5 tablets (150 mg total) by mouth at bedtime as needed for sleep., Disp: 135 tablet, Rfl: 1   triamcinolone cream (KENALOG) 0.1 %, Apply topically 2 (two) times daily as needed., Disp: , Rfl:   Observations/Objective: Patient is well-developed, well-nourished in no acute distress.  Resting comfortably  at home.  Head is normocephalic, atraumatic.  No labored breathing.  Speech is clear and coherent with logical content.  Patient is alert and oriented at baseline.    Assessment and Plan: There are no diagnoses linked to this encounter. Increase fluids, humidifier at night, UC as needed. We discussed a nebulizer. Advised to go to UC if sx persist or worsen.  Follow Up Instructions: I discussed the assessment and treatment plan with the patient. The patient was provided an opportunity to ask questions and all were answered. The patient agreed with the plan and demonstrated an understanding of the instructions.  A copy of instructions  were sent to the patient via MyChart unless otherwise noted below.     The patient was advised to call back or seek an in-person evaluation if the symptoms worsen or if the condition fails to improve as anticipated.    Florencio Hollibaugh, FNP

## 2023-10-20 ENCOUNTER — Telehealth: Payer: Self-pay | Admitting: Child and Adolescent Psychiatry

## 2023-10-27 ENCOUNTER — Telehealth: Payer: Self-pay | Admitting: Child and Adolescent Psychiatry

## 2023-10-27 DIAGNOSIS — F418 Other specified anxiety disorders: Secondary | ICD-10-CM

## 2023-10-27 DIAGNOSIS — F3341 Major depressive disorder, recurrent, in partial remission: Secondary | ICD-10-CM

## 2023-10-27 DIAGNOSIS — F902 Attention-deficit hyperactivity disorder, combined type: Secondary | ICD-10-CM

## 2023-10-27 DIAGNOSIS — F331 Major depressive disorder, recurrent, moderate: Secondary | ICD-10-CM

## 2023-10-27 DIAGNOSIS — F840 Autistic disorder: Secondary | ICD-10-CM

## 2023-10-27 MED ORDER — HYDROXYZINE HCL 25 MG PO TABS: 25.0000 mg | ORAL_TABLET | Freq: Two times a day (BID) | ORAL | 1 refills | Status: AC

## 2023-10-27 NOTE — Progress Notes (Signed)
 Virtual Visit via Video Note  I connected with Jasmine Carr on 10/27/23 at 11:30 AM EDT by a video enabled telemedicine application and verified that I am speaking with the correct person using two identifiers.  Location: Patient: home Provider: Office   I discussed the limitations of evaluation and management by telemedicine and the availability of in person appointments. The patient expressed understanding and agreed to proceed   I discussed the assessment and treatment plan with the patient. The patient was provided an opportunity to ask questions and all were answered. The patient agreed with the plan and demonstrated an understanding of the instructions.   The patient was advised to call back or seek an in-person evaluation if the symptoms worsen or if the condition fails to improve as anticipated.   Pilar Bridge, MD    [facility] MD/PA/NP OP Progress Note  10/27/2023 12:03 PM Jasmine Carr  MRN:  [id number]  Chief Complaint: Medication management follow-up for anxiety, ADHD and depression.  HPI:  This is a 18 year old Caucasian female, high school graduate with prior psychiatric history of ADHD, ODD, depression and anxiety, autism spectrum disorder, trichotillomania domiciled with biological parents.   She presented today for follow-up after 4 months.  She reported that she is doing really well, has lost about 70 pounds since the last appointment and losing weight has significantly improved her mood, and her activity level.  She denied excessive worries or anxiety, reported that she has been sleeping and eating well, and reported that her mood has been very good.  She denied problems with sleep, appetite or energy, takes trazodone  at night for sleep along with hydroxyzine  and that has been helpful.  She reported that she stays active, goes out with friends, and enjoys spending time with her family.  She reported that she was taking Rybelsus for weight loss  but because of nausea she has discontinued but despite that she has continued to lose weight.  She would write to lower her medications, as she feels tired on them.  We discussed indications for her medications, she would like to try discontinuing the Abilify .  We discussed that previously when it was discontinued, she became more aggressive, she would still like to try.  She will be started again with she notices no irritability or agitation.  She will follow-up again in about 3 months or early if needed.  I also discussed with her that I will be transitioning out of this clinic and will have limited availability, we will see her at 3 months interval and we will see if I can continue to see her in the clinic.  She verbalized understanding.  Visit Diagnosis:    ICD-10-CM   1. Other specified anxiety disorders  F41.8     2. Recurrent major depressive disorder, in partial remission (HCC)  F33.41     3. Attention deficit hyperactivity disorder (ADHD), combined type  F90.2     4. Autism  F84.0         Past Psychiatric History:   Inpatient: None RTC: None Outpatient:     - Meds: She is currently prescribed Prozac  40 mg once a day, Abilify  2 mg at bedtime, clonidine  0.2 mg at bedtime.       - Past medication trials include Seroquel for sleeping difficulties which was discontinued because of weight gain.  She has also tried various stimulants which were effective however stopped because of parents preference of keeping patient on less number of medications.  They deny any other medication trials.    - Therapy: Short term therapy - [facility]; Therapy off and on through out the life.. Now [facility] Hx of SI/HI:  None reported  Past Medical History:  Past Medical History:  Diagnosis Date   ADHD    Anxiety    OSA on CPAP 05/11/2018   severe    Past Surgical History:  Procedure Laterality Date   ADENOIDECTOMY     age 87 - [facility]   TYMPANOPLASTY  WITH GRAFT Right 08/25/2018   Procedure: TYMPANOPLASTY WITH POSSIBLE CONCHOL CARTILAGE GRAFT;  Surgeon: Lesly Raspberry, MD;  Location: [facility];  Service: ENT;  Laterality: Right;   TYMPANOSTOMY TUBE PLACEMENT Bilateral    age 71 - [facility]    Family Psychiatric History:     Sister  - Committed suicide - at the age of 3.  Mother - Bipolar disorder Father - ADHD  Family History: No family history on file.  Social History:  Social History   Socioeconomic History   Marital status: Single    Spouse name: Not on file   Number of children: Not on file   Years of education: Not on file   Highest education level: Not on file  Occupational History   Not on file  Tobacco Use   Smoking status: Never   Smokeless tobacco: Never  Vaping Use   Vaping status: Never Used  Substance and Sexual Activity   Alcohol use: Never   Drug use: Never   Sexual activity: Not on file  Other Topics Concern   Not on file  Social History Narrative   Not on file   Social Drivers of Health   Financial Resource Strain: Patient Declined (04/27/2023)   Received from [facility]   Overall Financial Resource Strain (CARDIA)    Difficulty of Paying Living Expenses: Patient declined  Food Insecurity: Patient Declined (04/27/2023)   Received from [facility]   Hunger Vital Sign    Worried About Running Out of Food in the Last Year: Patient declined    Ran Out of Food in the Last Year: Patient declined  Transportation Needs: Patient Declined (04/27/2023)   Received from [facility]   PRAPARE - Transportation    In the past 12 months, has lack of transportation kept you from medical appointments or from getting medications?: Patient declined    Lack of Transportation (Non-Medical): Patient declined  Physical Activity: Not on file  Stress: Not on file  Social Connections: Not on file    Allergies: No Known  Allergies  Metabolic Disorder Labs: No results found for: "HGBA1C", "MPG" No results found for: "PROLACTIN" No results found for: "CHOL", "TRIG", "HDL", "CHOLHDL", "VLDL", "LDLCALC" No results found for: "TSH"  Therapeutic Level Labs: No results found for: "LITHIUM" No results found for: "VALPROATE" No results found for: "CBMZ"  Current Medications: Current Outpatient Medications  Medication Sig Dispense Refill   fluticasone-salmeterol (WIXELA INHUB) 100-50 MCG/ACT AEPB Inhale 1 puff into the lungs.     metFORMIN (GLUCOPHAGE-XR) 500 MG 24 hr tablet Take 500 mg by mouth.     montelukast (SINGULAIR) 10 MG tablet Take 1 tablet by mouth daily.     Spacer/Aero-Holding Chambers [facility] DIAMOND) MISC Inhale into the lungs See admin instructions.     albuterol  (VENTOLIN  HFA) 108 (90 Base) MCG/ACT inhaler Inhale 2 puffs into the lungs every 6 (six) hours as needed for wheezing or  shortness of breath. 8 g 0   ARIPiprazole  (ABILIFY ) 2 MG tablet Take 1 tablet (2 mg total) by mouth daily. 90 tablet 1   benzonatate  (TESSALON ) 100 MG capsule Take 1 capsule (100 mg total) by mouth 3 (three) times daily as needed for cough. 30 capsule 0   escitalopram  (LEXAPRO ) 10 MG tablet Take 1 tablet (10 mg total) by mouth daily. 90 tablet 1   escitalopram  (LEXAPRO ) 5 MG tablet Take 1 tablet (5 mg total) by mouth daily. 90 tablet 1   famotidine (PEPCID) 20 MG tablet Take by mouth.     ferrous sulfate 325 (65 FE) MG tablet Take 325 mg by mouth 2 (two) times daily.     fluticasone (FLONASE) 50 MCG/ACT nasal spray Place 1 spray into both nostrils 2 (two) times daily.     hydrOXYzine  (ATARAX ) 25 MG tablet TAKE 1 TABLET BY MOUTH TWICE A DAY 180 tablet 1   ibuprofen  (ADVIL ) 600 MG tablet Take 1 tablet (600 mg total) by mouth every 8 (eight) hours as needed. 30 tablet 0   levonorgestrel-ethinyl estradiol (SEASONALE) 0.15-0.03 MG tablet Take 1 tablet by mouth daily.     lisdexamfetamine (VYVANSE ) 30 MG capsule Take 1  capsule (30 mg total) by mouth daily. 30 capsule 0   promethazine -dextromethorphan (PROMETHAZINE -DM) 6.25-15 MG/5ML syrup Take 5 mLs by mouth 4 (four) times daily as needed for up to 10 days for cough. 118 mL 0   traZODone  (DESYREL ) 100 MG tablet Take 1.5 tablets (150 mg total) by mouth at bedtime as needed for sleep. 135 tablet 1   triamcinolone cream (KENALOG) 0.1 % Apply topically 2 (two) times daily as needed.     No current facility-administered medications for this visit.     Musculoskeletal: Strength & Muscle Tone: unable to assess since visit was over the telemedicine. Gait & Station: unable to assess since visit was over the telemedicine. Patient leans: N/A  Psychiatric Specialty Exam: Review of Systems  There were no vitals taken for this visit.There is no height or weight on file to calculate BMI.  General Appearance: Casual and obese  Eye Contact:  Fair  Speech:  Clear and Coherent and Normal Rate  Volume:  Normal  Mood:  Anxious  Affect:  Appropriate, Congruent, and Restricted  Thought Process:  Goal Directed and Linear  Orientation:  Full (Time, Place, and Person)  Thought Content: Logical   Suicidal Thoughts:  No  Homicidal Thoughts:  No  Memory:  Immediate;   Fair Recent;   Fair Remote;   Fair  Judgement:  Fair  Insight:  Fair  Psychomotor Activity:  Normal  Concentration:  Concentration: Fair and Attention Span: Fair  Recall:  [employer] of Knowledge: Fair  Language: Fair  Akathisia:  No    AIMS (if indicated): not done  Assets:  Communication Skills Desire for Improvement Financial Resources/Insurance Leisure Time Physical Health Social Support Transportation Vocational/Educational  ADL's:  Intact  Cognition: WNL  Sleep:   Fair     Screenings: GAD-7    Advertising copywriter from 01/01/2020 in [facility]  Total GAD-7 Score 5      PHQ2-9    Flowsheet Row Counselor from 01/01/2020 in [facility]  PHQ-2 Total Score 2  PHQ-9 Total Score 3      Flowsheet Row ED from 05/10/2022 in [facility] [place]  ED from 08/12/2020 in [facility] [place]  C-SSRS RISK CATEGORY No Risk Error: Question 6 not populated        Assessment and Plan:   18 year old  [place] female with prior psychiatric history of ADHD; ODD; depression; anxiety; autism spectrum disorder; trichotillomania.   Update on 10/27/23  -she appears to have improvement with her mood and anxiety, losing weight appears to have improved her energy and activity.  She would like to discontinue Abilify , after discussing pros and cons, she wants to try not taking Abilify  and if she notices her self being more irritable or agitated then she will restart again.    Plan:  # Depression(improving) Anxiety(improving)  - Continue with Lexapro  15 mg daily.  - Discontinue Abilify  2 mg daily.   # ADHD - Discussed to Continue with Vyvanse  30 mg daily for attention problems.  # Sleep - Continue Trazodone  150 mg QHS PRN for sleep.    - Take Atarax  25-50 mg QHS PRN for sleep or anxiety.  - Discussed the recommendation of ind therapy for anxiety and grief.    - Follow up in 2-3 months or early if needed or if symptoms worsens.   This note was generated in part or whole with voice recognition software. Voice recognition is usually quite accurate but there are transcription errors that can and very often do occur. I apologize for any typographical errors that were not detected and corrected.         Pilar Bridge, MD 10/27/2023, 12:03 PM

## 2023-11-22 ENCOUNTER — Telehealth: Payer: Self-pay

## 2023-11-22 NOTE — Telephone Encounter (Signed)
 pt was notified that we don't do letter for emotional support animals because of legal reasons. we don't know the animals termperment

## 2023-11-22 NOTE — Telephone Encounter (Signed)
 pt left message that she wanted a emotional support animal letter

## 2023-12-09 ENCOUNTER — Ambulatory Visit (INDEPENDENT_AMBULATORY_CARE_PROVIDER_SITE_OTHER): Payer: Self-pay | Admitting: Physician Assistant

## 2023-12-09 ENCOUNTER — Encounter: Payer: Self-pay | Admitting: Physician Assistant

## 2023-12-09 VITALS — BP 112/74 | HR 92 | Temp 98.5°F | Ht 61.0 in | Wt 246.0 lb

## 2023-12-09 DIAGNOSIS — G4709 Other insomnia: Secondary | ICD-10-CM

## 2023-12-09 DIAGNOSIS — H9202 Otalgia, left ear: Secondary | ICD-10-CM | POA: Diagnosis not present

## 2023-12-09 DIAGNOSIS — J4520 Mild intermittent asthma, uncomplicated: Secondary | ICD-10-CM | POA: Insufficient documentation

## 2023-12-09 DIAGNOSIS — E66813 Obesity, class 3: Secondary | ICD-10-CM | POA: Insufficient documentation

## 2023-12-09 DIAGNOSIS — J309 Allergic rhinitis, unspecified: Secondary | ICD-10-CM | POA: Insufficient documentation

## 2023-12-09 DIAGNOSIS — K529 Noninfective gastroenteritis and colitis, unspecified: Secondary | ICD-10-CM | POA: Insufficient documentation

## 2023-12-09 MED ORDER — MONTELUKAST SODIUM 10 MG PO TABS: 10.0000 mg | ORAL_TABLET | Freq: Every day | ORAL | 3 refills | Status: AC

## 2023-12-09 NOTE — Assessment & Plan Note (Signed)
 Exam reassuring today.  Bilateral TMs are scarred.  Admitted to patient and mother that I am not adept at evaluating graft status, but the right TM is intact.    Encouraged to see if her prior ENT also accepts adults (as she was previously pediatric), and if so whether or not they take her insurance.  Informed that we also have a ENT in this building should she need a new referral.  For the next week, she has been been advised to avoid putting anything in the ears including Q-tips and earbuds.  Over-the-head style headphones should be okay, provided that the noise level is reasonable.

## 2023-12-09 NOTE — Assessment & Plan Note (Signed)
 Will be stopping metformin as of today.  See if this improves diarrhea.  If not, consider dicyclomine for possible IBS.  If still no improvement, will send to GI.

## 2023-12-09 NOTE — Assessment & Plan Note (Signed)
 Stop trazodone  as it is not seemingly useful for her.  Instead, we will try using hydroxyzine  25 to 50 mg at night since this medication is already in her possession.  If ineffective, she will let me know by calling the office or sending a message through MyChart.  Reassured that there are other options to help with sleep.

## 2023-12-09 NOTE — Assessment & Plan Note (Signed)
 Restart montelukast, which may also help with her otalgia in the event that this from eustachian tube dysfunction

## 2023-12-09 NOTE — Progress Notes (Signed)
 Date:  12/09/2023   Name:  Jasmine Carr   DOB:  Jun 26, 2006   MRN:  [id number]   Chief Complaint: Establish Care and Diarrhea (Can not eat with out haivng diarrhea, had endoscopy & colonoscopy appointment scheduled but lost insurance. Now has private insurance )  Diarrhea    Jasmine Carr is a very pleasant 19 year old female with a history of mild intermittent asthma, MDD, anxiety, insomnia, ASD, and ADHD who presents new to the practice today, but known to me through her mother Jasmine Carr who joins us  for appointment today.  Her mother has been trying to simplify Jasmine Carr's medication regimen, feeling that it was previously excessive for a 18 year old.  Struggles with chronic diarrhea, saw Dr. Mamie Carr previously with planned colonoscopy and endoscopy in Jan 2025, but this was canceled due to a lapse in insurance.  She recently obtained coverage through [facility], and wonders if she should reschedule with GI.  Describes a pronounced gastrocolic reflex.  Has never tried dicyclomine.  Also uses metformin daily, last A1c was 5.5% August 2024 and mother reports significant intentional weight loss over the last year, so wonders if metformin is still necessary.  Struggles chronically with insomnia, says trazodone  is not working for her, currently dosed as 150 mg at night as needed.  She also has hydroxyzine  for as needed use, but has not tried using this for sleep.  Complains of recently worsening left ear pain.  Mother thinks it could be related to listening to loud music with ear buds.  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  She asks me if the patch is still in place.  She also has a history of seasonal allergies and ran out of her montelukast.  Medication list has been reviewed and updated.  Current Meds  Medication Sig   albuterol  (VENTOLIN  HFA) 108 (90 Base) MCG/ACT  inhaler Inhale 2 puffs into the lungs every 6 (six) hours as needed for wheezing or shortness of breath.   fluticasone (FLONASE) 50 MCG/ACT nasal spray Place 1 spray into both nostrils 2 (two) times daily. (Patient taking differently: Place 1 spray into both nostrils as needed.)   fluticasone-salmeterol (WIXELA INHUB) 100-50 MCG/ACT AEPB Inhale 1 puff into the lungs. (Patient taking differently: Inhale 1 puff into the lungs as needed.)   hydrOXYzine  (ATARAX ) 25 MG tablet Take 1 tablet (25 mg total) by mouth 2 (two) times daily. (Patient taking differently: Take 25 mg by mouth every 4 (four) hours as needed.)   ibuprofen  (ADVIL ) 600 MG tablet Take 1 tablet (600 mg total) by mouth every 8 (eight) hours as needed.   levonorgestrel-ethinyl estradiol (SEASONALE) 0.15-0.03 MG tablet Take 1 tablet by mouth daily.   Spacer/Aero-Holding Chambers [facility] DIAMOND) MISC Inhale into the lungs See admin instructions.   [DISCONTINUED] benzonatate  (TESSALON ) 100 MG capsule Take 1 capsule (100 mg total) by mouth 3 (three) times daily as needed for cough.   [DISCONTINUED] metFORMIN (GLUCOPHAGE-XR) 500 MG 24 hr tablet Take 500 mg by mouth.   [DISCONTINUED] traZODone  (DESYREL ) 100 MG tablet Take 1.5 tablets (150 mg total) by mouth at bedtime as needed for sleep.     Review of Systems  Gastrointestinal:  Positive for diarrhea.    Patient Active Problem List   Diagnosis Date Noted   Chronic diarrhea 12/09/2023   Mild intermittent asthma 12/09/2023   Class 3 severe obesity with serious  comorbidity in adult 12/09/2023   Allergic rhinitis 12/09/2023   Otalgia, left 12/09/2023   Mild episode of recurrent major depressive disorder (HCC) 05/30/2023   Hyperlipidemia 10/07/2020   Metabolic syndrome 10/07/2020   OSA on CPAP 10/07/2020   Prediabetes 10/07/2020   Attention deficit hyperactivity disorder (ADHD), combined type 09/25/2019   Recurrent major depressive disorder, in partial remission (HCC) 09/25/2019    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   Other insomnia 09/25/2019   Autism 09/25/2019    No Known Allergies  Immunization History  Administered Date(s) Administered   Dtap, Unspecified 11/12/2003, 02/06/2004, 03/25/2004, 09/11/2004, 10/29/2008   HIB, Unspecified 11/12/2003, 02/06/2004, 03/25/2004, 12/23/2004   HPV 9-valent 05/04/2016   Hep B, Unspecified 2003-09-28, 11/12/2003, 03/25/2004   Hepatitis A, Ped/Adol-2 Dose 05/04/2016   Influenza,inj,Quad PF,6+ Mos 04/22/2015, 05/04/2016, 05/30/2017, 08/16/2018, 06/15/2019, 04/02/2020, 04/19/2022   Influenza-Unspecified 06/11/2004, 04/22/2015, 05/04/2016, 05/30/2017, 08/16/2018, 06/15/2019, 04/02/2020, 04/19/2022   MMR 09/11/2004, 10/29/2008   Meningococcal B, OMV 09/03/2020, 11/11/2021   Meningococcal Conjugate 09/03/2020   Meningococcal Mcv4o 04/22/2015, 09/03/2020   PFIZER(Purple Top)SARS-COV-2 Vaccination 09/13/2019, 10/09/2019, 07/08/2020   Pneumococcal-Unspecified 11/12/2003, 11/12/2003, 02/06/2004, 02/06/2004, 03/25/2004, 03/25/2004, 09/11/2004, 09/11/2004   Polio, Unspecified 11/12/2003, 02/06/2004, 06/09/2004, 10/29/2008   Tdap 04/22/2015   Varicella 09/11/2004, 10/29/2008    Past Surgical History:  Procedure Laterality Date   ADENOIDECTOMY     age 46 - [facility]   TYMPANOPLASTY WITH GRAFT Right 08/25/2018   Procedure: TYMPANOPLASTY WITH POSSIBLE CONCHOL CARTILAGE GRAFT;  Surgeon: Lesly Raspberry, MD;  Location: [facility];  Service: ENT;  Laterality: Right;   TYMPANOSTOMY TUBE PLACEMENT Bilateral    age 34 - [facility]    Social History   Tobacco Use   Smoking status: Never   Smokeless tobacco: Never  Vaping Use   Vaping status: Never Used  Substance Use Topics   Alcohol use: Never   Drug use: Never    History reviewed. No pertinent family history.      01/01/2020    2:00 PM  GAD 7 : Generalized Anxiety Score  Nervous, Anxious, on Edge   Control/stop worrying   Worry too  much - different things   Trouble relaxing   Restless   Easily annoyed or irritable   Afraid - awful might happen   Total GAD 7 Score   Anxiety Difficulty      Information is confidential and restricted. Go to Review Flowsheets to unlock data.       01/01/2020    2:03 PM  Depression screen PHQ 2/9  Decreased Interest   Down, Depressed, Hopeless   PHQ - 2 Score   Altered sleeping   Tired, decreased energy   Change in appetite   Feeling bad or failure about yourself    Trouble concentrating   Moving slowly or fidgety/restless   Suicidal thoughts   PHQ-9 Score      Information is confidential and restricted. Go to Review Flowsheets to unlock data.    BP Readings from Last 3 Encounters:  12/09/23 112/74  05/10/22 (!) 107/57  08/12/20 (!) 132/79 (98%, Z = 2.05 /  95%, Z = 1.64)*   *BP percentiles are based on the 2017 AAP Clinical Practice Guideline for girls    Wt Readings from Last 3 Encounters:  12/09/23 246 lb (111.6 kg)  08/12/20 (!) 230 lb (104.3 kg) (99%, Z= 2.33)*  05/30/20 (!) 253 lb 3.2 oz (114.9 kg) (>99%, Z= 2.51)*   * Growth percentiles are based on CDC (Girls, 2-20  Years) data.    BP 112/74   Pulse 92   Temp 98.5 F (36.9 C)   Ht 5' 1 (1.549 m)   Wt 246 lb (111.6 kg)   SpO2 97%   BMI 46.48 kg/m   Physical Exam Vitals and nursing note reviewed.  Constitutional:      Appearance: Normal appearance. She is obese.  HENT:     Right Ear: Ear canal normal. No middle ear effusion. Tympanic membrane is scarred and erythematous (mild). Tympanic membrane is not perforated.     Left Ear: Ear canal normal.  No middle ear effusion. Tympanic membrane is scarred. Tympanic membrane is not perforated or erythematous.   Cardiovascular:     Rate and Rhythm: Normal rate.  Pulmonary:     Effort: Pulmonary effort is normal.  Abdominal:     General: There is no distension.   Musculoskeletal:        General: Normal range of motion.   Skin:    General: Skin is  warm and dry.   Neurological:     Mental Status: She is alert and oriented to person, place, and time.     Gait: Gait is intact.   Psychiatric:        Mood and Affect: Mood and affect normal.     Recent Labs  No results found for: NA, K, CL, CO2, GLUCOSE, BUN, CREATININE, CALCIUM, PROT, ALBUMIN, AST, ALT, ALKPHOS, BILITOT, GFRNONAA, GFRAA  No results found for: WBC, HGB, HCT, MCV, PLT No results found for: HGBA1C No results found for: CHOL, HDL, LDLCALC, LDLDIRECT, TRIG, CHOLHDL No results found for: TSH   Assessment and Plan:  Chronic diarrhea Assessment & Plan: Will be stopping metformin as of today.  See if this improves diarrhea.  If not, consider dicyclomine for possible IBS.  If still no improvement, will send to GI.   Otalgia, left Assessment & Plan: Exam reassuring today.  Bilateral TMs are scarred.  Admitted to patient and mother that I am not adept at evaluating graft status, but the right TM is intact.    Encouraged to see if her prior ENT also accepts adults (as she was previously pediatric), and if so whether or not they take her insurance.  Informed that we also have a ENT in this building should she need a new referral.  For the next week, she has been been advised to avoid putting anything in the ears including Q-tips and earbuds.  Over-the-head style headphones should be okay, provided that the noise level is reasonable.    Allergic rhinitis, unspecified seasonality, unspecified trigger Assessment & Plan: Restart montelukast, which may also help with her otalgia in the event that this from eustachian tube dysfunction  Orders: -     Montelukast Sodium; Take 1 tablet (10 mg total) by mouth daily.  Dispense: 90 tablet; Refill: 3  Other insomnia Assessment & Plan: Stop trazodone  as it is not seemingly useful for her.  Instead, we will try using hydroxyzine  25 to 50 mg at night since this medication is  already in her possession.  If ineffective, she will let me know by calling the office or sending a message through MyChart.  Reassured that there are other options to help with sleep.      Return in about 4 weeks (around 01/06/2024) for OV f/u chronic conditions.    Cody Das, PA-C, DMSc, Nutritionist [facility] [phone]

## 2023-12-15 ENCOUNTER — Telehealth: Payer: Self-pay | Admitting: Physician Assistant

## 2023-12-15 DIAGNOSIS — H9202 Otalgia, left ear: Secondary | ICD-10-CM

## 2023-12-15 NOTE — Telephone Encounter (Signed)
 Copied from CRM [id number]. Topic: Referral - Request for Referral >> Dec 15, 2023 12:57 PM Alysia Jumbo S wrote: Did the patient discuss referral with their provider in the last year? Yes (If No - schedule appointment) (If Yes - send message)  Appointment offered? No  Type of order/referral and detailed reason for visit: ENT  Preference of office, provider, location: [place] [facility]  If referral order, have you been seen by this specialty before? Yes (If Yes, this issue or another issue? When? Where?  Can we respond through MyChart? No

## 2023-12-15 NOTE — Telephone Encounter (Signed)
 Please review patient's request for ENT referral.

## 2023-12-16 NOTE — Addendum Note (Signed)
 Addended by: Alfonza Angry on: 12/16/2023 02:25 PM   Modules accepted: Orders

## 2023-12-16 NOTE — Telephone Encounter (Signed)
 Patient has been made aware.

## 2023-12-16 NOTE — Addendum Note (Signed)
 Addended by: Alfonza Angry on: 12/16/2023 01:29 PM   Modules accepted: Orders

## 2023-12-21 ENCOUNTER — Telehealth: Payer: Self-pay | Admitting: Physician Assistant

## 2023-12-21 NOTE — Telephone Encounter (Signed)
Reached out to referrals team.  KP

## 2023-12-21 NOTE — Telephone Encounter (Signed)
 Copied from CRM [id number]. Topic: Referral - Question >> Dec 21, 2023 10:57 AM Willma R wrote: Reason for CRM: Patient states she was given a referral for ENT. Referral in her chart, called to schedule and they advised they do not accept her insurance. Is looking for an alternate ENT who will accept [facility].  Patient can be reached at [phone] Can contact mother, Alberta [phone]

## 2024-01-05 ENCOUNTER — Other Ambulatory Visit: Payer: Self-pay | Admitting: Child and Adolescent Psychiatry

## 2024-01-06 ENCOUNTER — Ambulatory Visit: Admitting: Physician Assistant

## 2024-01-09 ENCOUNTER — Telehealth: Payer: Self-pay | Admitting: Child and Adolescent Psychiatry

## 2024-01-09 NOTE — Telephone Encounter (Signed)
 Patient called stating she had to cancel her appointment due to [facility] not paying for our services.

## 2024-01-09 NOTE — Telephone Encounter (Signed)
Ok, Thanks for letting me know!

## 2024-01-13 ENCOUNTER — Ambulatory Visit (INDEPENDENT_AMBULATORY_CARE_PROVIDER_SITE_OTHER): Admitting: Physician Assistant

## 2024-01-13 ENCOUNTER — Encounter: Payer: Self-pay | Admitting: Physician Assistant

## 2024-01-13 ENCOUNTER — Ambulatory Visit: Payer: Self-pay

## 2024-01-13 VITALS — BP 110/86 | HR 97 | Temp 98.1°F | Ht 61.0 in | Wt 235.0 lb

## 2024-01-13 DIAGNOSIS — M791 Myalgia, unspecified site: Secondary | ICD-10-CM

## 2024-01-13 DIAGNOSIS — H9201 Otalgia, right ear: Secondary | ICD-10-CM | POA: Diagnosis not present

## 2024-01-13 MED ORDER — AZITHROMYCIN 250 MG PO TABS: ORAL_TABLET | ORAL | 0 refills | Status: AC

## 2024-01-13 NOTE — Progress Notes (Signed)
 Date:  01/13/2024   Name:  Jasmine Carr   DOB:  [birth year]   MRN:  [id number]   Chief Complaint: Ear Pain (Has blown ear drum, can't eat, coughing and sneezing hurts )  Otalgia  There is pain in the right ear. This is a new problem. Episode onset: X1 month. The problem occurs constantly. The problem has been gradually worsening. There has been no fever. The pain is at a severity of 9/10. The pain is moderate. Associated symptoms include diarrhea, headaches and neck pain. She has tried NSAIDs for the symptoms. The treatment provided mild relief.   Kierstynn presents today with her father for right-sided ear pain worse over the last month or so.  Also reports right sided jaw pain which is painful with eating.  Had some nausea and vomiting a few days ago as well.  She has a complicated otologic history, scheduled to establish with new ENT on 01/24/2024.  History of frequent ear infections.  Historically she does not tolerate amoxicillin /Augmentin  well as it upsets the stomach.  Usually azithromycin  helps with her ear symptoms so she is requesting this today.  Mentions she has been getting more migraines recently  Medication list has been reviewed and updated.  Current Meds  Medication Sig   albuterol  (VENTOLIN  HFA) 108 (90 Base) MCG/ACT inhaler Inhale 2 puffs into the lungs every 6 (six) hours as needed for wheezing or shortness of breath.   azithromycin  (ZITHROMAX ) 250 MG tablet Take 2 tablets on day 1, then 1 tablet daily on days 2 through 5   fluticasone (FLONASE) 50 MCG/ACT nasal spray Place 1 spray into both nostrils 2 (two) times daily.   fluticasone-salmeterol (WIXELA INHUB) 100-50 MCG/ACT AEPB Inhale 1 puff into the lungs.   hydrOXYzine  (ATARAX ) 25 MG tablet Take 1 tablet (25 mg total) by mouth 2 (two) times daily.   ibuprofen  (ADVIL ) 600 MG tablet Take 1 tablet (600 mg total) by mouth every 8 (eight) hours as needed.   levonorgestrel-ethinyl estradiol (SEASONALE)  0.15-0.03 MG tablet Take 1 tablet by mouth daily.   montelukast  (SINGULAIR ) 10 MG tablet Take 1 tablet (10 mg total) by mouth daily.   Spacer/Aero-Holding Chambers [facility] DIAMOND) MISC Inhale into the lungs See admin instructions.     Review of Systems  HENT:  Positive for ear pain.   Gastrointestinal:  Positive for diarrhea.  Musculoskeletal:  Positive for neck pain.  Neurological:  Positive for headaches.    Patient Active Problem List   Diagnosis Date Noted   Chronic diarrhea 12/09/2023   Mild intermittent asthma 12/09/2023   Class 3 severe obesity with serious comorbidity in adult 12/09/2023   Allergic rhinitis 12/09/2023   Otalgia, left 12/09/2023   Mild episode of recurrent major depressive disorder (HCC) 05/30/2023   Adult BMI 50.0-59.9 kg/sq m (HCC) 05/27/2022   Pilonidal cyst 05/24/2022   Hyperlipidemia 10/07/2020   Metabolic syndrome 10/07/2020   OSA on CPAP 10/07/2020   Prediabetes 10/07/2020   Hyperinsulinemia 10/07/2020   Tachycardia 10/07/2020   Attention deficit hyperactivity disorder (ADHD), combined type 09/25/2019   Recurrent major depressive disorder, in partial remission (HCC) 09/25/2019   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   Other insomnia 09/25/2019   Autism 09/25/2019    No Known Allergies  Immunization History  Administered Date(s) Administered   Dtap, Unspecified 11/12/2003, 02/06/2004, 03/25/2004, 09/11/2004, 10/29/2008   HIB, Unspecified 11/12/2003, 02/06/2004, 03/25/2004, 12/23/2004   HPV 9-valent 05/04/2016   Hep B, Unspecified Jun 19, 2004, 11/12/2003, 03/25/2004   Hepatitis  A, Ped/Adol-2 Dose 05/04/2016   Influenza,inj,Quad PF,6+ Mos 04/22/2015, 05/04/2016, 05/30/2017, 08/16/2018, 06/15/2019, 04/02/2020, 04/19/2022   Influenza-Unspecified 06/11/2004, 04/22/2015, 05/04/2016, 05/30/2017, 08/16/2018, 06/15/2019, 04/02/2020, 04/19/2022   MMR 09/11/2004, 10/29/2008   Meningococcal B, OMV 09/03/2020, 11/11/2021   Meningococcal  Conjugate 09/03/2020   Meningococcal Mcv4o 04/22/2015, 09/03/2020   PFIZER(Purple Top)SARS-COV-2 Vaccination 09/13/2019, 10/09/2019, 07/08/2020   Pneumococcal-Unspecified 11/12/2003, 11/12/2003, 02/06/2004, 02/06/2004, 03/25/2004, 03/25/2004, 09/11/2004, 09/11/2004   Polio, Unspecified 11/12/2003, 02/06/2004, 06/09/2004, 10/29/2008   Tdap 04/22/2015   Varicella 09/11/2004, 10/29/2008    Past Surgical History:  Procedure Laterality Date   ADENOIDECTOMY     age 18 - [facility]   TYMPANOPLASTY WITH GRAFT Right 08/25/2018   Procedure: TYMPANOPLASTY WITH POSSIBLE CONCHOL CARTILAGE GRAFT;  Surgeon: Herminio Miu, MD;  Location: [facility];  Service: ENT;  Laterality: Right;   TYMPANOSTOMY TUBE PLACEMENT Bilateral    age 18 - [facility]    Social History   Tobacco Use   Smoking status: Never   Smokeless tobacco: Never  Vaping Use   Vaping status: Never Used  Substance Use Topics   Alcohol use: Never   Drug use: Never    History reviewed. No pertinent family history.      01/13/2024    4:27 PM 01/01/2020    2:00 PM  GAD 7 : Generalized Anxiety Score  Nervous, Anxious, on Edge 0   Control/stop worrying 0   Worry too much - different things 0   Trouble relaxing 0   Restless 0   Easily annoyed or irritable 0   Afraid - awful might happen 0   Total GAD 7 Score 0   Anxiety Difficulty Not difficult at all      Information is confidential and restricted. Go to Review Flowsheets to unlock data.       01/13/2024    4:27 PM 01/01/2020    2:03 PM  Depression screen PHQ 2/9  Decreased Interest 0   Down, Depressed, Hopeless 0   PHQ - 2 Score 0   Altered sleeping    Tired, decreased energy    Change in appetite    Feeling bad or failure about yourself     Trouble concentrating    Moving slowly or fidgety/restless    Suicidal thoughts    PHQ-9 Score       Information is confidential and restricted. Go to Review Flowsheets to unlock data.    BP  Readings from Last 3 Encounters:  01/13/24 110/86  12/09/23 112/74  05/10/22 (!) 107/57    Wt Readings from Last 3 Encounters:  01/13/24 235 lb (106.6 kg)  12/09/23 246 lb (111.6 kg)  08/12/20 (!) 230 lb (104.3 kg) (99%, Z= 2.33)*   * Growth percentiles are based on CDC (Girls, 2-20 Years) data.    BP 110/86   Pulse 97   Temp 98.1 F (36.7 C)   Ht 5' 1 (1.549 m)   Wt 235 lb (106.6 kg)   SpO2 97%   BMI 44.40 kg/m   Physical Exam Vitals and nursing note reviewed.  Constitutional:      Appearance: Normal appearance.  HENT:     Right Ear: No middle ear effusion. There is mastoid tenderness (without overlying skin changes). Tympanic membrane is scarred. Tympanic membrane is not erythematous.     Left Ear:  No middle ear effusion. Tympanic membrane is scarred. Tympanic membrane is not erythematous.     Ears:     Comments: Tenderness elicited with manipulation of  the right pinna Cardiovascular:     Rate and Rhythm: Normal rate.  Pulmonary:     Effort: Pulmonary effort is normal.  Abdominal:     General: There is no distension.  Musculoskeletal:        General: Normal range of motion.     Comments: Moderate muscle tension of bilateral trapezius and lateral neck muscles  Lymphadenopathy:     Cervical: No cervical adenopathy.  Skin:    General: Skin is warm and dry.  Neurological:     Mental Status: She is alert and oriented to person, place, and time.     Gait: Gait is intact.  Psychiatric:        Mood and Affect: Mood and affect normal.     Recent Labs  No results found for: NA, K, CL, CO2, GLUCOSE, BUN, CREATININE, CALCIUM, PROT, ALBUMIN, AST, ALT, ALKPHOS, BILITOT, GFRNONAA, GFRAA  No results found for: WBC, HGB, HCT, MCV, PLT No results found for: HGBA1C No results found for: CHOL, HDL, LDLCALC, LDLDIRECT, TRIG, CHOLHDL No results found for: TSH   Assessment and Plan:  1. Otalgia, right ear  (Primary) No obvious sign of ear infection on exam today though she does have mastoid tenderness and pain with manipulation of pinna.  Reviewed with patient and father that azithromycin  is not classically used for ear infections.  However, because historically she has had good results with this antibiotic which has been well-tolerated, I will send a 5-day course of azithromycin  for her.  Continue OTC NSAID use, which we reviewed.  Follow-up with ENT  - azithromycin  (ZITHROMAX ) 250 MG tablet; Take 2 tablets on day 1, then 1 tablet daily on days 2 through 5  Dispense: 6 tablet; Refill: 0  2. Muscle tension pain Reviewed with patient that I do believe at least part of the pain she is describing is associated with muscle tension, which is apparent on her exam today.  Of course this could also be contributing to her increased frequency of headaches recently.  For now, we will treat conservatively with heat, massage, and stretching.  Option for muscle relaxant, but deferred for now.  Patient will reach out next week if she changes her mind    Follow-up with me as scheduled 01/25/2024   Rolan Hoyle, PA-C, DMSc, Nutritionist [facility] [phone]

## 2024-01-13 NOTE — Telephone Encounter (Signed)
 Noted  KP

## 2024-01-13 NOTE — Telephone Encounter (Signed)
 FYI Only or Action Required?: Action required by provider: request for appointment.  Patient was last seen in primary care on 12/09/2023 by Manya Toribio SQUIBB, PA.  Called Nurse Triage reporting Otalgia.  Symptoms began about a month ago.  Interventions attempted: OTC medications: ibuprofen  .  Symptoms are: gradually worsening.  Triage Disposition: See HCP Within 4 Hours (Or PCP Triage)  Patient/caregiver understands and will follow disposition?: YesCopied from CRM [id number]. Topic: Clinical - Red Word Triage >> Jan 13, 2024  1:32 PM Rachelle R wrote: [facility] that prompted transfer to Nurse Triage: Patient states her right ear drum is blown and is experiencing serve pain on the right side of her face and mouth. States is unable to eat the pain is so bad. Reason for Disposition  [1] SEVERE pain (e.g., excruciating) and [2] not improved 2 hours after pain medicine (e.g., acetaminophen  or ibuprofen )  Answer Assessment - Initial Assessment Questions 1. LOCATION: Which ear is involved?     Right ear 2. ONSET: When did the ear pain start?      Over a month ago 3. SEVERITY: How bad is the pain?  (Scale 1-10; mild, moderate or severe)     severe 4. URI SYMPTOMS: Do you have a runny nose or cough?     na 5. FEVER: Do you have a fever? If Yes, ask: What is your temperature, how was it measured, and when did it start?     Na  6. CAUSE: Have you been swimming recently?, How often do you use Q-TIPS?, Have you had any recent air travel or scuba diving?     Busted eardrum 7. OTHER SYMPTOMS: Do you have any other symptoms? (e.g., decreased hearing, dizziness, headache, stiff neck, vomiting)     Vomiting due to pain   Ear has been draining clear fluid. Pt is waiting to see ENT on 7/29. Pt states pain is unbearable and is asking for antibiotic. Pt took ibuprofen  1 hour ago.  Protocols used: Rilla

## 2024-01-16 ENCOUNTER — Other Ambulatory Visit: Payer: Self-pay | Admitting: Physician Assistant

## 2024-01-16 ENCOUNTER — Ambulatory Visit: Payer: Self-pay

## 2024-01-16 ENCOUNTER — Telehealth: Payer: Self-pay | Admitting: Child and Adolescent Psychiatry

## 2024-01-16 MED ORDER — BACLOFEN 10 MG PO TABS: 10.0000 mg | ORAL_TABLET | Freq: Three times a day (TID) | ORAL | 1 refills | Status: DC | PRN

## 2024-01-16 NOTE — Telephone Encounter (Signed)
 Please review.  KP

## 2024-01-16 NOTE — Telephone Encounter (Signed)
 FYI Only or Action Required?: Action required by provider: Patient would like muscle relaxer prescribed that was discussed during recent visit.  Patient was last seen in primary care on 01/13/2024 by Manya Toribio SQUIBB, PA.  Called Nurse Triage reporting Otalgia.  Symptoms began about a month ago.  Interventions attempted: Prescription medications: Zithromax .  Symptoms are: unchanged.  Triage Disposition: See PCP Within 2 Weeks  Patient/caregiver understands and will follow disposition?: Yes    Copied from CRM [id number]. Topic: Clinical - Red Word Triage >> Jan 16, 2024  2:38 PM Kevelyn M wrote: Red Word that prompted transfer to Nurse Triage: Ear pain still persisting even after taking the antibiotics. Seen on 01/13/2024.      Reason for Disposition  [1] Taking antibiotic < 72 hours (3 days) AND [2] ear discharge from ear canal also present  Answer Assessment - Initial Assessment Questions Dr. Manya offered a muscle relaxer during her last visit that the patient would like prescribed due to her symptoms not improving on the antibiotic. Please advise.     1. ANTIBIOTIC: What antibiotic are you taking? How many times per day?     Zithromax   2. ONSET: When was the antibiotic started?     7/19 3. LOCATION: Which ear is involved?     Right ear  4. PAIN: How bad is the pain?   (Scale 0-10; none, mild, moderate or severe)     8/10 5. FEVER: Do you have a fever? If Yes, ask: What is your temperature, how was it measured, and when did it start?     No 6. DISCHARGE: Is there any discharge? If Yes, ask: What color is it? (e.g., clear, white; yellow, green; bloody)     Clear discharge  7. OTHER SYMPTOMS: Do you have any other symptoms? (e.g., headache, stiff neck, dizziness, vomiting, runny nose)     Pain to right sided jaw, neck, and shoulder  Protocols used: Ear - Otitis Media Follow-up Call-A-AH

## 2024-01-16 NOTE — Telephone Encounter (Signed)
 FYI  KP

## 2024-01-23 NOTE — Telephone Encounter (Signed)
Please review patient's message:

## 2024-01-24 ENCOUNTER — Telehealth: Payer: Self-pay

## 2024-01-24 NOTE — Telephone Encounter (Signed)
 Patient wanted to have a hearing test done at her physical. Amy said we do not have equipment for that in office.

## 2024-01-24 NOTE — Telephone Encounter (Signed)
 Please review.  KP

## 2024-01-25 ENCOUNTER — Ambulatory Visit: Admitting: Physician Assistant

## 2024-01-31 NOTE — Telephone Encounter (Signed)
Please review patient's message:

## 2024-02-15 ENCOUNTER — Encounter: Payer: Self-pay | Admitting: Physician Assistant

## 2024-02-15 ENCOUNTER — Ambulatory Visit (INDEPENDENT_AMBULATORY_CARE_PROVIDER_SITE_OTHER): Admitting: Physician Assistant

## 2024-02-15 VITALS — BP 118/86 | HR 97 | Temp 98.8°F | Ht 61.0 in | Wt 238.0 lb

## 2024-02-15 DIAGNOSIS — E782 Mixed hyperlipidemia: Secondary | ICD-10-CM

## 2024-02-15 DIAGNOSIS — R7303 Prediabetes: Secondary | ICD-10-CM

## 2024-02-15 DIAGNOSIS — D509 Iron deficiency anemia, unspecified: Secondary | ICD-10-CM | POA: Insufficient documentation

## 2024-02-15 DIAGNOSIS — E559 Vitamin D deficiency, unspecified: Secondary | ICD-10-CM | POA: Insufficient documentation

## 2024-02-15 DIAGNOSIS — K529 Noninfective gastroenteritis and colitis, unspecified: Secondary | ICD-10-CM

## 2024-02-15 DIAGNOSIS — E66813 Obesity, class 3: Secondary | ICD-10-CM

## 2024-02-15 DIAGNOSIS — R718 Other abnormality of red blood cells: Secondary | ICD-10-CM | POA: Insufficient documentation

## 2024-02-15 MED ORDER — DICYCLOMINE HCL 20 MG PO TABS: 20.0000 mg | ORAL_TABLET | Freq: Four times a day (QID) | ORAL | 1 refills | Status: DC | PRN

## 2024-02-15 NOTE — Assessment & Plan Note (Signed)
 Weight relatively stable since stopping metformin.  Check routine labs today.

## 2024-02-15 NOTE — Assessment & Plan Note (Signed)
Check nonfasting lipids

## 2024-02-15 NOTE — Assessment & Plan Note (Signed)
 Suspect IBS-D, though we reviewed this is a diagnosis of exclusion..  Sending GI referral.  In the meantime, trial of Bentyl  up to 4 times per day

## 2024-02-15 NOTE — Progress Notes (Signed)
 Date:  02/15/2024   Name:  Jasmine Carr   DOB:  [birth year]   MRN:  [id number]   Chief Complaint: Medical Management of Chronic Issues, Diarrhea, and Ear Problem (When walking it pops, hears a echo, right ear)  Diarrhea  This is a recurrent problem. Episode onset: X1 year. The problem occurs 5 to 10 times per day. The problem has been gradually worsening. The stool consistency is described as Watery. The patient states that diarrhea awakens her from sleep. Associated symptoms include headaches. Nothing aggravates the symptoms. There are no known risk factors. She has tried nothing for the symptoms.   Itzia presents for 35-month follow-up on chronic conditions.  Last visit we discussed her chronic diarrhea and stopped metformin, but unfortunately this has not improved things.  We also discussed insomnia, stop trazodone  in favor of hydroxyzine  nightly as needed.  She established care with ENT as of 01/24/2024 to evaluate her complaint of chronic otalgia and neck/jaw pain.  ENT does not feel that this pain has any origin within the ears, but suspected musculoskeletal etiology instead. Candace reports patient sleeps on the couch, which she suspects is the crux of the issue. Presently, patient complains of popping and echo on the right side. Already using antihistamine and intranasal corticosteroid.   Patient reports that she gets sick (meaning diarrhea) after naps and sleep, as well as after food. It is interfering with her daily activities and ability to leave the home. Saw [facility] as a pediatric patient, but needs a new referral as an adult.   History of iron deficiency anemia, has not taken iron in 6-12 months by her estimate last provider stopped prescribing it.    Medication list has been reviewed and updated.  Current Meds  Medication Sig   albuterol  (VENTOLIN  HFA) 108 (90 Base) MCG/ACT inhaler Inhale 2 puffs into the lungs every 6 (six) hours as needed for  wheezing or shortness of breath.   baclofen  (LIORESAL ) 10 MG tablet Take 1 tablet (10 mg total) by mouth 3 (three) times daily as needed for muscle spasms.   dicyclomine  (BENTYL ) 20 MG tablet Take 1 tablet (20 mg total) by mouth 4 (four) times daily as needed for spasms.   fluticasone (FLONASE) 50 MCG/ACT nasal spray Place 1 spray into both nostrils 2 (two) times daily.   fluticasone-salmeterol (WIXELA INHUB) 100-50 MCG/ACT AEPB Inhale 1 puff into the lungs.   hydrOXYzine  (ATARAX ) 25 MG tablet Take 1 tablet (25 mg total) by mouth 2 (two) times daily.   ibuprofen  (ADVIL ) 600 MG tablet Take 1 tablet (600 mg total) by mouth every 8 (eight) hours as needed.   levonorgestrel-ethinyl estradiol (SEASONALE) 0.15-0.03 MG tablet Take 1 tablet by mouth daily.   montelukast  (SINGULAIR ) 10 MG tablet Take 1 tablet (10 mg total) by mouth daily.   Spacer/Aero-Holding Chambers [facility] DIAMOND) MISC Inhale into the lungs See admin instructions.     Review of Systems  Gastrointestinal:  Positive for diarrhea.  Neurological:  Positive for headaches.    Patient Active Problem List   Diagnosis Date Noted   Iron deficiency anemia 02/15/2024   Vitamin D deficiency 02/15/2024   Microcytosis 02/15/2024   Chronic diarrhea 12/09/2023   Mild intermittent asthma 12/09/2023   Class 3 severe obesity with serious comorbidity in adult 12/09/2023   Allergic rhinitis 12/09/2023   Otalgia, left 12/09/2023   Mild episode of recurrent major depressive disorder (HCC) 05/30/2023   Adult BMI 50.0-59.9 kg/sq m (HCC) 05/27/2022  Pilonidal cyst 05/24/2022   Hyperlipidemia 10/07/2020   Metabolic syndrome 10/07/2020   OSA on CPAP 10/07/2020   Prediabetes 10/07/2020   Hyperinsulinemia 10/07/2020   Attention deficit hyperactivity disorder (ADHD), combined type 09/25/2019   Recurrent major depressive disorder, in partial remission (HCC) 09/25/2019   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   Other insomnia 09/25/2019    Autism 09/25/2019    No Known Allergies  Immunization History  Administered Date(s) Administered   Dtap, Unspecified 11/12/2003, 02/06/2004, 03/25/2004, 09/11/2004, 10/29/2008   HIB, Unspecified 11/12/2003, 02/06/2004, 03/25/2004, 12/23/2004   HPV 9-valent 05/04/2016   Hep B, Unspecified October 09, 2003, 11/12/2003, 03/25/2004   Hepatitis A, Ped/Adol-2 Dose 05/04/2016   Influenza,inj,Quad PF,6+ Mos 04/22/2015, 05/04/2016, 05/30/2017, 08/16/2018, 06/15/2019, 04/02/2020, 04/19/2022   Influenza-Unspecified 06/11/2004, 04/22/2015, 05/04/2016, 05/30/2017, 08/16/2018, 06/15/2019, 04/02/2020, 04/19/2022   MMR 09/11/2004, 10/29/2008   Meningococcal B, OMV 09/03/2020, 11/11/2021   Meningococcal Conjugate 09/03/2020   Meningococcal Mcv4o 04/22/2015, 09/03/2020   PFIZER(Purple Top)SARS-COV-2 Vaccination 09/13/2019, 10/09/2019, 07/08/2020   Pneumococcal-Unspecified 11/12/2003, 11/12/2003, 02/06/2004, 02/06/2004, 03/25/2004, 03/25/2004, 09/11/2004, 09/11/2004   Polio, Unspecified 11/12/2003, 02/06/2004, 06/09/2004, 10/29/2008   Tdap 04/22/2015   Varicella 09/11/2004, 10/29/2008    Past Surgical History:  Procedure Laterality Date   ADENOIDECTOMY     age 86 - [facility]   TYMPANOPLASTY WITH GRAFT Right 08/25/2018   Procedure: TYMPANOPLASTY WITH POSSIBLE CONCHOL CARTILAGE GRAFT;  Surgeon: Herminio Miu, MD;  Location: [facility];  Service: ENT;  Laterality: Right;   TYMPANOSTOMY TUBE PLACEMENT Bilateral    age 28 - [facility]    Social History   Tobacco Use   Smoking status: Never   Smokeless tobacco: Never  Vaping Use   Vaping status: Never Used  Substance Use Topics   Alcohol use: Never   Drug use: Never    History reviewed. No pertinent family history.      02/15/2024    1:23 PM 01/13/2024    4:27 PM 01/01/2020    2:00 PM  GAD 7 : Generalized Anxiety Score  Nervous, Anxious, on Edge 0 0   Control/stop worrying 0 0   Worry too much - different  things 0 0   Trouble relaxing 0 0   Restless 0 0   Easily annoyed or irritable 0 0   Afraid - awful might happen  0   Total GAD 7 Score  0   Anxiety Difficulty Not difficult at all Not difficult at all      Information is confidential and restricted. Go to Review Flowsheets to unlock data.       02/15/2024    1:23 PM 01/13/2024    4:27 PM 01/01/2020    2:03 PM  Depression screen PHQ 2/9  Decreased Interest 0 0   Down, Depressed, Hopeless 0 0   PHQ - 2 Score 0 0   Altered sleeping     Tired, decreased energy     Change in appetite     Feeling bad or failure about yourself      Trouble concentrating     Moving slowly or fidgety/restless     Suicidal thoughts     PHQ-9 Score        Information is confidential and restricted. Go to Review Flowsheets to unlock data.    BP Readings from Last 3 Encounters:  02/15/24 118/86  01/13/24 110/86  12/09/23 112/74    Wt Readings from Last 3 Encounters:  02/15/24 238 lb (108 kg)  01/13/24 235 lb (106.6 kg)  12/09/23  246 lb (111.6 kg)    BP 118/86   Pulse 97   Temp 98.8 F (37.1 C)   Ht 5' 1 (1.549 m)   Wt 238 lb (108 kg)   SpO2 97%   BMI 44.97 kg/m   Physical Exam  Recent Labs  No results found for: NA, K, CL, CO2, GLUCOSE, BUN, CREATININE, CALCIUM, PROT, ALBUMIN, AST, ALT, ALKPHOS, BILITOT, GFRNONAA, GFRAA  No results found for: WBC, HGB, HCT, MCV, PLT No results found for: HGBA1C No results found for: CHOL, HDL, LDLCALC, LDLDIRECT, TRIG, CHOLHDL No results found for: TSH    Assessment and Plan:  Chronic diarrhea Assessment & Plan: Suspect IBS-D, though we reviewed this is a diagnosis of exclusion..  Sending GI referral.  In the meantime, trial of Bentyl  up to 4 times per day  Orders: -     Dicyclomine  HCl; Take 1 tablet (20 mg total) by mouth 4 (four) times daily as needed for spasms.  Dispense: 90 tablet; Refill: 1 -     Ambulatory referral to  Gastroenterology  Prediabetes Assessment & Plan: Due for A1c recheck after stopping metformin.  Last A1c 5.5%.  Orders: -     Hemoglobin A1c  Microcytosis Assessment & Plan: Repeat CBC and iron panel.  Will refill iron tablets if necessary.  This may have the added bonus of helping with her diarrhea.  Orders: -     CBC with Differential/Platelet -     Iron, TIBC and Ferritin Panel  Iron deficiency anemia, unspecified iron deficiency anemia type Assessment & Plan: Repeat CBC and iron panel.  Will refill iron tablets if necessary.  This may have the added bonus of helping with her diarrhea.  Orders: -     CBC with Differential/Platelet -     Iron, TIBC and Ferritin Panel  Vitamin D deficiency Assessment & Plan: Last vitamin D was 26 about 4 years ago.  Not presently supplementing, recheck today  Orders: -     VITAMIN D 25 Hydroxy (Vit-D Deficiency, Fractures)  Mixed hyperlipidemia Assessment & Plan: Check nonfasting lipids  Orders: -     Lipid panel  Class 3 severe obesity with serious comorbidity in adult Assessment & Plan: Weight relatively stable since stopping metformin.  Check routine labs today.  Orders: -     CBC with Differential/Platelet -     Comprehensive metabolic panel with GFR -     TSH -     Lipid panel -     VITAMIN D 25 Hydroxy (Vit-D Deficiency, Fractures) -     Hemoglobin A1c     Return in about 4 weeks (around 03/14/2024) for OV f/u Bentyl .    Rolan Hoyle, PA-C, DMSc, Nutritionist [facility] Primary [facility] and Sports Medicine [facility] [phone]

## 2024-02-15 NOTE — Assessment & Plan Note (Signed)
 Repeat CBC and iron panel.  Will refill iron tablets if necessary.  This may have the added bonus of helping with her diarrhea.

## 2024-02-15 NOTE — Assessment & Plan Note (Signed)
 Due for A1c recheck after stopping metformin.  Last A1c 5.5%.

## 2024-02-15 NOTE — Assessment & Plan Note (Signed)
 Last vitamin D was 26 about 4 years ago.  Not presently supplementing, recheck today

## 2024-02-20 ENCOUNTER — Ambulatory Visit: Admitting: Physician Assistant

## 2024-02-20 ENCOUNTER — Encounter: Payer: Self-pay | Admitting: Pediatrics

## 2024-02-22 ENCOUNTER — Ambulatory Visit: Payer: Self-pay

## 2024-02-22 NOTE — Telephone Encounter (Signed)
  FYI Only or Action Required?: FYI only for provider.  Patient was last seen in primary care on 02/15/2024 by Manya Toribio SQUIBB, PA.  Called Nurse Triage reporting Diarrhea.  Symptoms began x 3 to 4 days.  Interventions attempted: Nothing.  Symptoms are: gradually worsening.  Triage Disposition: See PCP When Office is Open (Within 3 Days)  Patient/caregiver understands and will follow disposition?: Yes   Copied from CRM #[id number]. Topic: Clinical - Red Word Triage >> Feb 22, 2024  2:17 PM Charlet HERO wrote: Red Word that prompted transfer to Nurse Triage: Place IBS med and for the last couple of 3-4 patient has been having diahreah with bleeding and blood clots and nausea. dicyclomine  (BENTYL ) 20 MG tablet 4x a day. Reason for Disposition  Diarrhea continues for > 3 days after stopping the antibiotic  Answer Assessment - Initial Assessment Questions 1. ANTIBIOTIC: What antibiotic are you taking? How many times per day? Bentyl  2. ANTIBIOTIC ONSET: When was the antibiotic started?     na 3. DIARRHEA SEVERITY: How bad is the diarrhea? How many more stools have you had in the past 24 hours than normal?      severe 4. ONSET: When did the diarrhea begin?      X 3 to 4 days 5. BM CONSISTENCY: How loose or watery is the diarrhea?      Watery, loose, blood and blood clots - bright red blood 6. VOMITING: Are you also vomiting? If Yes, ask: How many times in the past 24 hours?      no 7. ABDOMEN PAIN: Are you having any abdomen pain? If Yes, ask: What does it feel like? (e.g., crampy, dull, intermittent, constant)      no 8. ABDOMEN PAIN SEVERITY: If present, ask: How bad is the pain?  (e.g., Scale 1-10; mild, moderate, or severe)     no 9. ORAL INTAKE: If vomiting, Have you been able to drink liquids? How much liquids have you had in the past 24 hours?     Drinking fluid 10. HYDRATION: Any signs of dehydration? (e.g., dry mouth [not just dry lips], too weak  to stand, dizziness, new weight loss) When did you last urinate?       na 11. EXPOSURE: Have you traveled to a foreign country recently? Have you been exposed to anyone with diarrhea? Could you have eaten any food that was spoiled?       no 12. OTHER SYMPTOMS: Do you have any other symptoms? (e.g., fever, blood in stool)       Nausea after eating 13. PREGNANCY: Is there any chance you are pregnant? When was your last menstrual period?       na  Protocols used: Diarrhea on Antibiotics-A-AH

## 2024-02-23 ENCOUNTER — Ambulatory Visit: Admitting: Physician Assistant

## 2024-02-28 NOTE — Telephone Encounter (Signed)
 FYI  KP

## 2024-03-22 ENCOUNTER — Ambulatory Visit: Admitting: Physician Assistant

## 2024-03-29 ENCOUNTER — Other Ambulatory Visit: Payer: Self-pay | Admitting: Physician Assistant

## 2024-03-29 ENCOUNTER — Ambulatory Visit: Payer: Self-pay | Admitting: Physician Assistant

## 2024-03-29 DIAGNOSIS — E559 Vitamin D deficiency, unspecified: Secondary | ICD-10-CM

## 2024-03-29 LAB — COMPREHENSIVE METABOLIC PANEL WITH GFR
ALT: 22 IU/L (ref 0–32)
AST: 14 IU/L (ref 0–40)
Albumin: 3.6 g/dL — ABNORMAL LOW (ref 4.0–5.0)
Alkaline Phosphatase: 127 IU/L — ABNORMAL HIGH (ref 42–106)
BUN/Creatinine Ratio: 14 (ref 9–23)
BUN: 8 mg/dL (ref 6–20)
Bilirubin Total: 0.4 mg/dL (ref 0.0–1.2)
CO2: 23 mmol/L (ref 20–29)
Calcium: 9.1 mg/dL (ref 8.7–10.2)
Chloride: 101 mmol/L (ref 96–106)
Creatinine, Ser: 0.56 mg/dL — ABNORMAL LOW (ref 0.57–1.00)
Globulin, Total: 3.4 g/dL (ref 1.5–4.5)
Glucose: 79 mg/dL (ref 70–99)
Potassium: 4.2 mmol/L (ref 3.5–5.2)
Sodium: 137 mmol/L (ref 134–144)
Total Protein: 7 g/dL (ref 6.0–8.5)
eGFR: 134 mL/min/1.73 (ref >59)

## 2024-03-29 LAB — CBC WITH DIFFERENTIAL/PLATELET
Basophils Absolute: 0 x10E3/uL (ref 0.0–0.2)
Basos: 1 %
EOS (ABSOLUTE): 0.2 x10E3/uL (ref 0.0–0.4)
Eos: 3 %
Hematocrit: 43.8 % (ref 34.0–46.6)
Hemoglobin: 13.2 g/dL (ref 11.1–15.9)
Immature Grans (Abs): 0 x10E3/uL (ref 0.0–0.1)
Immature Granulocytes: 0 %
Lymphocytes Absolute: 1.4 x10E3/uL (ref 0.7–3.1)
Lymphs: 29 %
MCH: 24.6 pg — ABNORMAL LOW (ref 26.6–33.0)
MCHC: 30.1 g/dL — ABNORMAL LOW (ref 31.5–35.7)
MCV: 82 fL (ref 79–97)
Monocytes Absolute: 0.3 x10E3/uL (ref 0.1–0.9)
Monocytes: 7 %
Neutrophils Absolute: 2.8 x10E3/uL (ref 1.4–7.0)
Neutrophils: 60 %
Platelets: 420 x10E3/uL (ref 150–450)
RBC: 5.37 x10E6/uL — ABNORMAL HIGH (ref 3.77–5.28)
RDW: 15.7 % — ABNORMAL HIGH (ref 11.7–15.4)
WBC: 4.6 x10E3/uL (ref 3.4–10.8)

## 2024-03-29 LAB — LIPID PANEL
Chol/HDL Ratio: 6.4 ratio — ABNORMAL HIGH (ref 0.0–4.4)
Cholesterol, Total: 172 mg/dL (ref 100–199)
HDL: 27 mg/dL — ABNORMAL LOW (ref >39)
LDL Chol Calc (NIH): 127 mg/dL — ABNORMAL HIGH (ref 0–99)
Triglycerides: 96 mg/dL (ref 0–149)
VLDL Cholesterol Cal: 18 mg/dL (ref 5–40)

## 2024-03-29 LAB — IRON,TIBC AND FERRITIN PANEL
Ferritin: 38 ng/mL (ref 15–150)
Iron Saturation: 11 % — ABNORMAL LOW (ref 15–55)
Iron: 37 ug/dL (ref 27–159)
Total Iron Binding Capacity: 334 ug/dL (ref 250–450)
UIBC: 297 ug/dL (ref 131–425)

## 2024-03-29 LAB — HEMOGLOBIN A1C
Est. average glucose Bld gHb Est-mCnc: 111 mg/dL
Hgb A1c MFr Bld: 5.5 % (ref 4.8–5.6)

## 2024-03-29 LAB — VITAMIN D 25 HYDROXY (VIT D DEFICIENCY, FRACTURES): Vit D, 25-Hydroxy: 16.6 ng/mL — ABNORMAL LOW (ref 30.0–100.0)

## 2024-03-29 LAB — TSH: TSH: 1.32 u[IU]/mL (ref 0.450–4.500)

## 2024-03-29 MED ORDER — CHOLECALCIFEROL 1.25 MG (50000 UT) PO CAPS: 50000.0000 [IU] | ORAL_CAPSULE | ORAL | 0 refills | Status: AC

## 2024-03-29 NOTE — Telephone Encounter (Signed)
 Please review.  KP

## 2024-04-03 ENCOUNTER — Other Ambulatory Visit: Payer: Self-pay | Admitting: Physician Assistant

## 2024-04-03 DIAGNOSIS — K529 Noninfective gastroenteritis and colitis, unspecified: Secondary | ICD-10-CM

## 2024-04-04 NOTE — Telephone Encounter (Signed)
 Requested Prescriptions  Pending Prescriptions Disp Refills   dicyclomine  (BENTYL ) 20 MG tablet [Pharmacy Med Name: DICYCLOMINE  20 MG TABLET] 90 tablet 1    Sig: TAKE 1 TABLET (20 MG TOTAL) BY MOUTH 4 (FOUR) TIMES DAILY AS NEEDED FOR SPASMS.     Gastroenterology:  Antispasmodic Agents Passed - 04/04/2024  2:40 PM      Passed - Valid encounter within last 12 months    Recent Outpatient Visits           1 month ago Chronic diarrhea   [facility] at [facility], Toribio SQUIBB, [place]   2 months ago Otalgia, right ear   [facility] at [facility], Torib[place], [place]   3 months ago Chronic diarrhea   [facility] at [facility], Toribio SQUIBB, [place]

## 2024-04-11 ENCOUNTER — Ambulatory Visit: Admitting: Pediatrics

## 2024-04-22 ENCOUNTER — Other Ambulatory Visit: Payer: Self-pay | Admitting: Physician Assistant

## 2024-04-24 NOTE — Telephone Encounter (Signed)
 Requested medication (s) are due for refill today: yes  Requested medication (s) are on the active medication list: yes  Last refill:  01/16/24  Future visit scheduled: no  Notes to clinic:  routing for review     Requested Prescriptions  Pending Prescriptions Disp Refills   baclofen  (LIORESAL ) 10 MG tablet [Pharmacy Med Name: BACLOFEN  10 MG TABLET] 30 tablet 1    Sig: TAKE 1 TABLET BY MOUTH THREE TIMES A DAY AS NEEDED FOR MUSCLE SPASMS     Analgesics:  Muscle Relaxants - baclofen  Failed - 04/24/2024 12:02 PM      Failed - Cr in normal range and within 180 days    Creatinine, Ser  Date Value Ref Range Status  03/28/2024 0.56 (L) 0.57 - 1.00 mg/dL Final         Passed - eGFR is 30 or above and within 180 days    eGFR  Date Value Ref Range Status  03/28/2024 134 >59 mL/min/1.73 Final         Passed - Valid encounter within last 6 months    Recent Outpatient Visits           2 months ago Chronic diarrhea   [facility] at [facility], Toribio [place]QU[place], [place]   3 months ago Otalgia, right ear   [facility] Medicine at [facility], Toribio SQUIBB, [place]   4 months ago Chronic diarrhea   [facility] Primary Care & Sports Medicine at [facility], Toribio SQUIBB, [place]

## 2024-05-07 ENCOUNTER — Ambulatory Visit: Payer: Self-pay

## 2024-05-07 ENCOUNTER — Encounter: Admitting: Physician Assistant

## 2024-05-07 NOTE — Telephone Encounter (Signed)
 FYI Only or Action Required?: FYI only for provider: appointment scheduled on 11/11.  Patient was last seen in primary care on 02/15/2024 by Manya Toribio SQUIBB, PA.  Called Nurse Triage reporting Vaginal Bleeding.  Symptoms began several months ago. Symptoms are: unchanged.  Triage Disposition: See PCP When Office is Open (Within 3 Days)  Patient/caregiver understands and will follow disposition?: Yes     Copied from CRM [id number]. Topic: Clinical - Red Word Triage >> May 07, 2024  1:33 PM Wess RAMAN wrote: Red Word that prompted transfer to Nurse Triage: Painful cramps, Nausea, heavy bleeding menstrual cycle      Reason for Disposition  [1] Heavier than normal periods (e.g., doubling up on pads to prevent leaking, needing to change pads overnight, unable to do normal activities) AND [2] last > 7 days  Answer Assessment - Initial Assessment Questions 1. BLEEDING SEVERITY: Describe the bleeding that you are having. How much bleeding is there?      Heavy bleeding  2. ONSET: When did the bleeding begin? Is it continuing now?     6 months, occurs every couple of weeks  3. MENSTRUAL PERIOD: When was the last normal menstrual period? How is this different than your period?     Over 6 months ago 4. REGULARITY: How regular are your periods?     Not regular now 5. ABDOMEN PAIN: Do you have any pain? How bad is the pain?  (e.g., Scale 0-10; none, mild, moderate, or severe)     Moderate cramps  6. PREGNANCY: Is there any chance you are pregnant? When was your last menstrual period?     No 7. BREASTFEEDING: Are you breastfeeding?     No 8. HORMONE MEDICINES: Are you taking any hormone medicines, prescription or over-the-counter? (e.g., birth control pills, estrogen)     Taking birth control that is not working according to patient  9. BLOOD THINNER MEDICINES: Do you take any blood thinners? (e.g., Coumadin / warfarin, Pradaxa / dabigatran, aspirin)      No 10. CAUSE: What do you think is causing the bleeding? (e.g., recent gyn surgery, recent gyn procedure; known bleeding disorder, cervical cancer, polycystic ovarian disease, fibroids)         Birth control not working 11. HEMODYNAMIC STATUS: Are you weak or feeling lightheaded? If Yes, ask: Can you stand and walk normally?        No dizziness  12. OTHER SYMPTOMS: What other symptoms are you having with the bleeding? (e.g., passed tissue, vaginal discharge, fever, menstrual-type cramps)       Nausea  Protocols used: Vaginal Bleeding - Abnormal-A-AH

## 2024-05-08 ENCOUNTER — Encounter: Payer: Self-pay | Admitting: Physician Assistant

## 2024-05-08 ENCOUNTER — Ambulatory Visit (INDEPENDENT_AMBULATORY_CARE_PROVIDER_SITE_OTHER): Admitting: Physician Assistant

## 2024-05-08 VITALS — BP 132/74 | HR 88 | Temp 98.4°F | Ht 61.0 in | Wt 240.0 lb

## 2024-05-08 DIAGNOSIS — L600 Ingrowing nail: Secondary | ICD-10-CM | POA: Diagnosis not present

## 2024-05-08 DIAGNOSIS — N946 Dysmenorrhea, unspecified: Secondary | ICD-10-CM

## 2024-05-08 NOTE — Progress Notes (Signed)
 Date:  05/08/2024   Name:  Jasmine Carr   DOB:  [birth year]   MRN:  [id number]   Chief Complaint: Vaginal Bleeding (Vaginal bleeding for several months. One week she is on her cycle and next week she will be off then back on her cycle while on birth control. Cycle cause nausea and vomiting with cramps. No OB/GYN.)  Vaginal Bleeding    Jasmine Carr presents today for dysmenorrhea with breakthrough bleeding q2wk for about 6 months following previous absence of menses on OCP (no placebo week). Symptoms associated with nausea, vomiting, and pelvic cramps. Takes ibuprofen  200 mg up to 2-3 tablets every 12 h.  Episodes typically last 4-5 days. There is family history of dysmenorrhea in her grandmother, but etiology was never established. Jasmine Carr has never been sexually active, never had a pap smear, and does not presently have GYN. OCP was last prescribed by her pediatrician.   She also desires referral to podiatry for recurrent ingrown toenails. Does not wear socks - says it makes it worse. Presents today in boots. She is not trying any therapies at home.   Medication list has been reviewed and updated.  Current Meds  Medication Sig   albuterol  (VENTOLIN  HFA) 108 (90 Base) MCG/ACT inhaler Inhale 2 puffs into the lungs every 6 (six) hours as needed for wheezing or shortness of breath.   baclofen  (LIORESAL ) 10 MG tablet TAKE 1 TABLET BY MOUTH THREE TIMES A DAY AS NEEDED FOR MUSCLE SPASMS   Cholecalciferol 1.25 MG (50000 UT) capsule Take 1 capsule (50,000 Units total) by mouth once a week for 12 doses.   dicyclomine  (BENTYL ) 20 MG tablet TAKE 1 TABLET (20 MG TOTAL) BY MOUTH 4 (FOUR) TIMES DAILY AS NEEDED FOR SPASMS.   fluticasone (FLONASE) 50 MCG/ACT nasal spray Place 1 spray into both nostrils 2 (two) times daily.   fluticasone-salmeterol (WIXELA INHUB) 100-50 MCG/ACT AEPB Inhale 1 puff into the lungs.   hydrOXYzine  (ATARAX ) 25 MG tablet Take 1 tablet (25 mg total) by mouth 2 (two)  times daily.   ibuprofen  (ADVIL ) 600 MG tablet Take 1 tablet (600 mg total) by mouth every 8 (eight) hours as needed.   levonorgestrel-ethinyl estradiol (SEASONALE) 0.15-0.03 MG tablet Take 1 tablet by mouth daily.   montelukast  (SINGULAIR ) 10 MG tablet Take 1 tablet (10 mg total) by mouth daily.   Spacer/Aero-Holding Chambers [facility] DIAMOND) MISC Inhale into the lungs See admin instructions.     Review of Systems  Genitourinary:  Positive for vaginal bleeding.    Patient Active Problem List   Diagnosis Date Noted   Iron deficiency anemia 02/15/2024   Vitamin D deficiency 02/15/2024   Microcytosis 02/15/2024   Chronic diarrhea 12/09/2023   Mild intermittent asthma 12/09/2023   Class 3 severe obesity with serious comorbidity in adult [facility]) 12/09/2023   Allergic rhinitis 12/09/2023   Otalgia, left 12/09/2023   Mild episode of recurrent major depressive disorder 05/30/2023   Adult BMI 50.0-59.9 kg/sq m (HCC) 05/27/2022   Pilonidal cyst 05/24/2022   Hyperlipidemia 10/07/2020   Metabolic syndrome 10/07/2020   OSA on CPAP 10/07/2020   Prediabetes 10/07/2020   Hyperinsulinemia 10/07/2020   Attention deficit hyperactivity disorder (ADHD), combined type 09/25/2019   Recurrent major depressive disorder, in partial remission 09/25/2019   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   Other insomnia 09/25/2019   Autism 09/25/2019    No Known Allergies  Immunization History  Administered Date(s) Administered   Dtap, Unspecified 11/12/2003, 02/06/2004, 03/25/2004, 09/11/2004, 10/29/2008  HIB, Unspecified 11/12/2003, 02/06/2004, 03/25/2004, 12/23/2004   HPV 9-valent 05/04/2016   Hep B, Unspecified 2003/07/20, 11/12/2003, 03/25/2004   Hepatitis A, Ped/Adol-2 Dose 05/04/2016   Influenza,inj,Quad PF,6+ Mos 04/22/2015, 05/04/2016, 05/30/2017, 08/16/2018, 06/15/2019, 04/02/2020, 04/19/2022   Influenza-Unspecified 06/11/2004, 04/22/2015, 05/04/2016, 05/30/2017, 08/16/2018,  06/15/2019, 04/02/2020, 04/19/2022   MMR 09/11/2004, 10/29/2008   Meningococcal B, OMV 09/03/2020, 11/11/2021   Meningococcal Conjugate 09/03/2020   Meningococcal Mcv4o 04/22/2015, 09/03/2020   PFIZER(Purple Top)SARS-COV-2 Vaccination 09/13/2019, 10/09/2019, 07/08/2020   Pneumococcal-Unspecified 11/12/2003, 11/12/2003, 02/06/2004, 02/06/2004, 03/25/2004, 03/25/2004, 09/11/2004, 09/11/2004   Polio, Unspecified 11/12/2003, 02/06/2004, 06/09/2004, 10/29/2008   Tdap 04/22/2015   Varicella 09/11/2004, 10/29/2008    Past Surgical History:  Procedure Laterality Date   ADENOIDECTOMY     age 52 - [facility]   TYMPANOPLASTY WITH GRAFT Right 08/25/2018   Procedure: TYMPANOPLASTY WITH POSSIBLE CONCHOL CARTILAGE GRAFT;  Surgeon: Herminio Miu, MD;  Location: [facility];  Service: ENT;  Laterality: Right;   TYMPANOSTOMY TUBE PLACEMENT Bilateral    age 1 - [facility]    Social History   Tobacco Use   Smoking status: Never   Smokeless tobacco: Never  Vaping Use   Vaping status: Never Used  Substance Use Topics   Alcohol use: Never   Drug use: Never    History reviewed. No pertinent family history.      02/15/2024    1:23 PM 01/13/2024    4:27 PM 01/01/2020    2:00 PM  GAD 7 : Generalized Anxiety Score  Nervous, Anxious, on Edge 0 0   Control/stop worrying 0 0   Worry too much - different things 0 0   Trouble relaxing 0 0   Restless 0 0   Easily annoyed or irritable 0 0   Afraid - awful might happen  0   Total GAD 7 Score  0   Anxiety Difficulty Not difficult at all Not difficult at all      Information is confidential and restricted. Go to Review Flowsheets to unlock data.       05/08/2024   11:23 AM 02/15/2024    1:23 PM 01/13/2024    4:27 PM  Depression screen PHQ 2/9  Decreased Interest 0 0 0  Down, Depressed, Hopeless 0 0 0  PHQ - 2 Score 0 0 0    BP Readings from Last 3 Encounters:  05/08/24 132/74  02/15/24 118/86  01/13/24 110/86     Wt Readings from Last 3 Encounters:  05/08/24 240 lb (108.9 kg)  02/15/24 238 lb (108 kg)  01/13/24 235 lb (106.6 kg)    BP 132/74   Pulse 88   Temp 98.4 F (36.9 C) (Oral)   Ht 5' 1 (1.549 m)   Wt 240 lb (108.9 kg)   SpO2 98%   BMI 45.35 kg/m   Physical Exam Vitals and nursing note reviewed.  Constitutional:      Appearance: Normal appearance. She is obese.  Cardiovascular:     Rate and Rhythm: Normal rate and regular rhythm.     Heart sounds: No murmur heard.    No friction rub. No gallop.  Pulmonary:     Effort: Pulmonary effort is normal.     Breath sounds: Normal breath sounds.  Abdominal:     General: Bowel sounds are decreased. There is no distension.     Tenderness: There is abdominal tenderness in the right lower quadrant, suprapubic area and left lower quadrant.  Musculoskeletal:  General: Normal range of motion.  Feet:     Comments: Mild ingrown nail of left great toe, lateral aspect. No evidence of acute infection requiring urgent intervention. Skin:    General: Skin is warm and dry.  Neurological:     Mental Status: She is alert and oriented to person, place, and time.     Gait: Gait is intact.  Psychiatric:        Mood and Affect: Mood and affect normal.     Recent Labs     Component Value Date/Time   NA 137 03/28/2024 1030   K 4.2 03/28/2024 1030   CL 101 03/28/2024 1030   CO2 23 03/28/2024 1030   GLUCOSE 79 03/28/2024 1030   BUN 8 03/28/2024 1030   CREATININE 0.56 (L) 03/28/2024 1030   CALCIUM 9.1 03/28/2024 1030   PROT 7.0 03/28/2024 1030   ALBUMIN 3.6 (L) 03/28/2024 1030   AST 14 03/28/2024 1030   ALT 22 03/28/2024 1030   ALKPHOS 127 (H) 03/28/2024 1030   BILITOT 0.4 03/28/2024 1030    Lab Results  Component Value Date   WBC 4.6 03/28/2024   HGB 13.2 03/28/2024   HCT 43.8 03/28/2024   MCV 82 03/28/2024   PLT 420 03/28/2024   Lab Results  Component Value Date   HGBA1C 5.5 03/28/2024   Lab Results  Component Value  Date   CHOL 172 03/28/2024   HDL 27 (L) 03/28/2024   LDLCALC 127 (H) 03/28/2024   TRIG 96 03/28/2024   CHOLHDL 6.4 (H) 03/28/2024   Lab Results  Component Value Date   TSH 1.320 03/28/2024      Assessment and Plan:  1. Dysmenorrhea (Primary) Submitting urgent referral to GYN for further evaluation. Continue OCP for now. Continue ibuprofen  PRN but always with food.  - Ambulatory referral to Gynecology  2. Ingrown toenail of left foot Routine podiatry referral. Also counseled on proper footwear, use of socks, and saltwater soaks.  - Ambulatory referral to Podiatry    Patient will use MyChart to schedule a CPE at her convenience.    Rolan Hoyle, PA-C, DMSc, Nutritionist [facility] [phone]

## 2024-05-11 ENCOUNTER — Ambulatory Visit: Payer: Self-pay

## 2024-05-11 ENCOUNTER — Other Ambulatory Visit: Payer: Self-pay

## 2024-05-11 ENCOUNTER — Other Ambulatory Visit: Payer: Self-pay | Admitting: Physician Assistant

## 2024-05-11 ENCOUNTER — Encounter: Payer: Self-pay | Admitting: *Deleted

## 2024-05-11 DIAGNOSIS — R112 Nausea with vomiting, unspecified: Secondary | ICD-10-CM

## 2024-05-11 MED ORDER — ONDANSETRON HCL 4 MG PO TABS: 4.0000 mg | ORAL_TABLET | Freq: Three times a day (TID) | ORAL | 0 refills | Status: DC | PRN

## 2024-05-11 MED ORDER — ONDANSETRON 4 MG PO TBDP: 4.0000 mg | ORAL_TABLET | Freq: Three times a day (TID) | ORAL | 0 refills | Status: DC | PRN

## 2024-05-11 NOTE — Telephone Encounter (Signed)
 Noted  KP

## 2024-05-11 NOTE — Telephone Encounter (Signed)
 This encounter was created in error - please disregard.

## 2024-05-11 NOTE — Telephone Encounter (Signed)
 FYI Only or Action Required?: FYI only for provider: OBGYN office number provided to pt to schedule appt.  Patient was last seen in primary care on 05/08/2024 by Manya Toribio SQUIBB, PA.  Called Nurse Triage reporting Advice Only (/).  Symptoms began several months ago.  Interventions attempted: OTC medications: Ibuprofen , Midol  and Prescription medications: levonorgestrel-ethinyl estradiol (SEASONALE) 0.15-0.03 MG tablet.  Symptoms are: unchanged.  Triage Disposition: Information or Advice Only Call  Patient/caregiver understands and will follow disposition?: Yes  Copied from CRM [id number]. Topic: Clinical - Red Word Triage >> May 11, 2024  2:15 PM Antwanette L wrote: Red Word that prompted transfer to Nurse Triage:  The patient is calling because her birth control is not working. She is currently waiting on her OB-GYN referral. The patient reports persistent vomiting and nausea, stating she is unable to eat without feeling sick. Patient is also feeling fatigue. She is requesting anti-nausea medication Reason for Disposition  Health information question, no triage required and triager able to answer question  Answer Assessment - Initial Assessment Questions 1. REASON FOR CALL: What is the main reason for your call? or How can I best help you?     Pt reports hx of heavy periods controlled by birth control for several years (levonorgestrel-ethinyl estradiol (SEASONALE) 0.15-0.03 MG tablet). Reports the birth control no longer effective at managing painful heavy periods and that they have returned, occurring about once every other week for the past 6 months. Saw PCP for current issue on 05/08/24 and advised to continue birth control, take ibuprofen  for pain, and would send an urgent referral to OBGYN for pt to be seen. Pt reports no changes in symptoms since OV on 11/11, just calling to check status of referral. Referral noted to have been sent and pt ready to schedule at [facility].  Gave pt their number [phone] to call and schedule an appt. Advise UC or ED for worsening symptoms until then.    2. SYMPTOMS : Do you have any symptoms?      Heavy periods every other week with cramps, nausea and vomiting. Ongoing for past 6 months   3. OTHER QUESTIONS: Do you have any other questions?     Denies  Protocols used: Information Only Call - No Triage-A-AH

## 2024-05-11 NOTE — Telephone Encounter (Signed)
 Please review.  KP

## 2024-05-11 NOTE — Telephone Encounter (Signed)
 FYI  Sent in Zofran .  KP

## 2024-05-11 NOTE — Telephone Encounter (Signed)
 FYI Only or Action Required?: Action required by provider: medication refill request, referral request, and requesting different medication than zofran  for nausea that insurance will pay for or zofran  4 mg tablet not disintergrating.  Patient was last seen in primary care on 05/08/2024 by Jasmine Toribio SQUIBB, PA.  Called Nurse Triage reporting Advice Only (/).  Symptoms began see previous triage .  Interventions attempted: Other: called OBGYN to schedule appt and office can not see patient until January.  Symptoms are: unchanged.  Triage Disposition: Information or Advice Only Call  Patient/caregiver understands and will follow disposition?: Yes      Please advise regarding medication request for nausea and recommended if sx worsening  as reported in prior triage today , prior to seeing OBGYN ,go to ED. Patient requesting a call back.  CAL notified regarding  insurance coverage for zofran  4 mg disintegrating medication.

## 2024-05-16 ENCOUNTER — Ambulatory Visit: Payer: Self-pay | Admitting: Podiatry

## 2024-05-16 VITALS — Ht 61.0 in | Wt 240.0 lb

## 2024-05-16 DIAGNOSIS — L600 Ingrowing nail: Secondary | ICD-10-CM

## 2024-05-17 ENCOUNTER — Telehealth: Payer: Self-pay | Admitting: Podiatry

## 2024-05-17 ENCOUNTER — Encounter: Payer: Self-pay | Admitting: Podiatry

## 2024-05-17 MED ORDER — NEOMYCIN-POLYMYXIN-HC 3.5-10000-1 OT SUSP: OTIC | 0 refills | Status: AC

## 2024-05-17 NOTE — Telephone Encounter (Signed)
 Patient states antibiotic drops were discussed in the visit. However the pharmacy hasn't received a script.

## 2024-05-17 NOTE — Progress Notes (Signed)
  Subjective:  Patient ID: Jasmine Carr, female    DOB: [birth year],  MRN: [id number]  Chief Complaint  Patient presents with   Ingrown Toenail    RM 3 NP - Patient is here for ingrown toenail of left hallux. Patient would like right hallux evaluated ( no pain or discomfort at this time).     18 y.o. female presents with the above complaint. History confirmed with patient.   Objective:  Physical Exam: warm, good capillary refill, no trophic changes or ulcerative lesions, normal DP and PT pulses, and normal sensory exam. Left Foot: Ingrown hallux medial lateral borders no infection Right Foot: No active ingrown   Assessment:   1. Ingrowing left great toenail      Plan:  Patient was evaluated and treated and all questions answered.     Ingrown Nail, left -Patient elects to proceed with minor surgery to remove ingrown toenail today. Consent reviewed and signed by patient. -Ingrown nail excised. See procedure note. -Educated on post-procedure care including soaking. Written instructions provided and reviewed. -Rx for Cortisporin sent to pharmacy. -Advised on signs and symptoms of infection developing.  We discussed that the phenol likely will create some redness and edema and tenderness around the nailbed as long as it is localized this is to be expected.  Will return as needed if any infection signs develop  Procedure: Excision of Ingrown Toenail Location: Left 1st toe medial and lateral nail borders. Anesthesia: Lidocaine  1% plain; 1.5 mL and Marcaine 0.5% plain; 1.5 mL, digital block. Skin Prep: Betadine. Dressing: Silvadene; telfa; dry, sterile, compression dressing. Technique: Following skin prep, the toe was exsanguinated and a tourniquet was secured at the base of the toe. The affected nail border was freed, split with a nail splitter, and excised. Chemical matrixectomy was then performed with phenol and irrigated out with alcohol. The tourniquet was then  removed and sterile dressing applied. Disposition: Patient tolerated procedure well.    No follow-ups on file.

## 2024-05-31 ENCOUNTER — Other Ambulatory Visit: Payer: Self-pay | Admitting: Physician Assistant

## 2024-05-31 DIAGNOSIS — R112 Nausea with vomiting, unspecified: Secondary | ICD-10-CM

## 2024-06-01 ENCOUNTER — Telehealth: Payer: Self-pay | Admitting: Physician Assistant

## 2024-06-01 NOTE — Telephone Encounter (Signed)
 Copied from CRM [id number]. Topic: Clinical - Medication Refill >> Jun 01, 2024  2:49 PM Rosaria E wrote: Medication:  ondansetron  (ZOFRAN ) 4 MG tablet   Has the patient contacted their pharmacy? Yes (Agent: If no, request that the patient contact the pharmacy for the refill. If patient does not wish to contact the pharmacy document the reason why and proceed with request.) (Agent: If yes, when and what did the pharmacy advise?)  This is the patient's preferred pharmacy:  [facility] [phone] [address], [place] - [address] [place] [address] Phone: [phone] Fax: [phone]  Is this the correct pharmacy for this prescription? Yes If no, delete pharmacy and type the correct one.   Has the prescription been filled recently? Yes  Is the patient out of the medication? Yes  Has the patient been seen for an appointment in the last year OR does the patient have an upcoming appointment? Yes  Can we respond through MyChart? Yes  Agent: Please be advised that Rx refills may take up to 3 business days. We ask that you follow-up with your pharmacy.

## 2024-06-02 NOTE — Telephone Encounter (Signed)
 Already requested in a separate refill encounter on 05/31/24, routed to office, pending approval.

## 2024-06-02 NOTE — Telephone Encounter (Signed)
 Requested medication (s) are due for refill today: Yes  Requested medication (s) are on the active medication list: Yes  Last refill:  05/11/24  Future visit scheduled: No  Notes to clinic:  Unable to refill per protocol, cannot delegate.      Requested Prescriptions  Pending Prescriptions Disp Refills   ondansetron  (ZOFRAN ) 4 MG tablet [Pharmacy Med Name: ONDANSETRON  HCL 4 MG TABLET] 20 tablet 0    Sig: TAKE 1 TABLET BY MOUTH EVERY 8 HOURS AS NEEDED FOR NAUSEA AND VOMITING     Not Delegated - Gastroenterology: Antiemetics - ondansetron  Failed - 06/02/2024  7:00 AM      Failed - This refill cannot be delegated      Passed - AST in normal range and within 360 days    AST  Date Value Ref Range Status  03/28/2024 14 0 - 40 IU/L Final         Passed - ALT in normal range and within 360 days    ALT  Date Value Ref Range Status  03/28/2024 22 0 - 32 IU/L Final         Passed - Valid encounter within last 6 months    Recent Outpatient Visits           3 weeks ago Dysmenorrhea   [facility] Medicine at [facility], [place], [place]   3 months ago Chronic diarrhea   [facility] Medicine at [facility], [place], [place]   4 months ago Otalgia, right ear   [facility] at [facility], [place], [place]   5 months ago Chronic diarrhea   [facility] Medicine at [facility], [place], [place]

## 2024-06-04 ENCOUNTER — Encounter (HOSPITAL_BASED_OUTPATIENT_CLINIC_OR_DEPARTMENT_OTHER): Admitting: Certified Nurse Midwife

## 2024-06-11 ENCOUNTER — Telehealth: Payer: Self-pay

## 2024-07-08 ENCOUNTER — Other Ambulatory Visit: Payer: Self-pay | Admitting: Physician Assistant

## 2024-07-08 DIAGNOSIS — E559 Vitamin D deficiency, unspecified: Secondary | ICD-10-CM

## 2024-07-10 ENCOUNTER — Other Ambulatory Visit: Payer: Self-pay

## 2024-07-10 NOTE — Telephone Encounter (Signed)
 Requested medications are due for refill today.  Unsure - pt was to take for 12 weeks  Requested medications are on the active medications list.  no  Last refill. 03/29/2024  Future visit scheduled.   yes  Notes to clinic.  Provider to review at this dosage.    Requested Prescriptions  Pending Prescriptions Disp Refills   Cholecalciferol  (VITAMIN D3) 1.25 MG (50000 UT) CAPS [Pharmacy Med Name: VITAMIN D3 50,000 UNIT CAPSULE] 12 capsule 0    Sig: TAKE 1 CAPSULE (50,000 UNITS TOTAL) BY MOUTH ONCE A WEEK FOR 12 DOSES     Endocrinology:  Vitamins - Vitamin D  Supplementation 2 Failed - 07/10/2024  7:59 AM      Failed - Manual Review: Route requests for 50,000 IU strength to the provider      Failed - Vitamin D  in normal range and within 360 days    Vit D, 25-Hydroxy  Date Value Ref Range Status  03/28/2024 16.6 (L) 30.0 - 100.0 ng/mL Final    Comment:    Vitamin D  deficiency has been defined by the Institute of Medicine and an Endocrine Society practice guideline as a level of serum 25-OH vitamin D  less than 20 ng/mL (1,2). The Endocrine Society went on to further define vitamin D  insufficiency as a level between 21 and 29 ng/mL (2). 1. IOM (Institute of Medicine). 2010. Dietary reference    intakes for calcium and D. [place]: The    [employer]. 2. Holick MF, Binkley La[place], Bischoff-Ferrari HA, et al.    Evaluation, treatment, and prevention of vitamin D     deficiency: an Endocrine Society clinical practice    guideline. JCEM. 2011 Jul; 96(7):1911-30.          Passed - Ca in normal range and within 360 days    Calcium  Date Value Ref Range Status  03/28/2024 9.1 8.7 - 10.2 mg/dL Final         Passed - Valid encounter within last 12 months    Recent Outpatient Visits           2 months ago Dysmenorrhea   [facility] at [facility], [place], [place]   4 months ago Chronic diarrhea   [facility] Primary Care & [facility]  Medicine at [facility], [place], [place]   5 months ago Otalgia, right ear   [facility] Primary [facility] & [facility] Medicine at [facility], [place], [place]   7 months ago Chronic diarrhea   [facility] at [facility], [place], [place]

## 2024-07-18 ENCOUNTER — Encounter: Admitting: Physician Assistant

## 2024-07-20 ENCOUNTER — Encounter: Admitting: Physician Assistant

## 2024-07-25 ENCOUNTER — Encounter: Admitting: Physician Assistant

## 2024-08-13 ENCOUNTER — Encounter (HOSPITAL_BASED_OUTPATIENT_CLINIC_OR_DEPARTMENT_OTHER): Admitting: Obstetrics and Gynecology
